# Patient Record
Sex: Female | Born: 1965
Health system: Southern US, Community
[De-identification: ages and names within clinical notes are randomized; demographics above are authoritative.]

---

## 2013-10-27 IMAGING — CT CT HEAD W/O CM
1 series · 16 of 30 positions shown, 20 images · non-contrast
Comparison: None.

CLINICAL DATA: Headache

EXAM:
CT HEAD WITHOUT CONTRAST
TECHNIQUE: Contiguous axial images were obtained from the base of the skull
through the vertex without intravenous contrast.

[Series 2: head 5.0 h30s · axial · 0.41mm/px · z∈[-108,+27]mm · 16 of 30 slices shown, 20 images]
[im 2/30  brain]
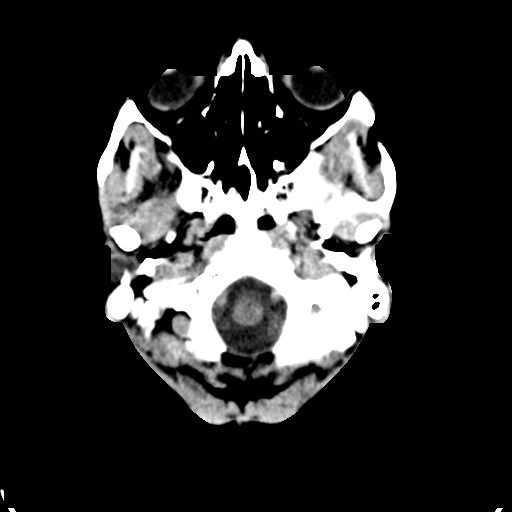
[im 2/30  bone]
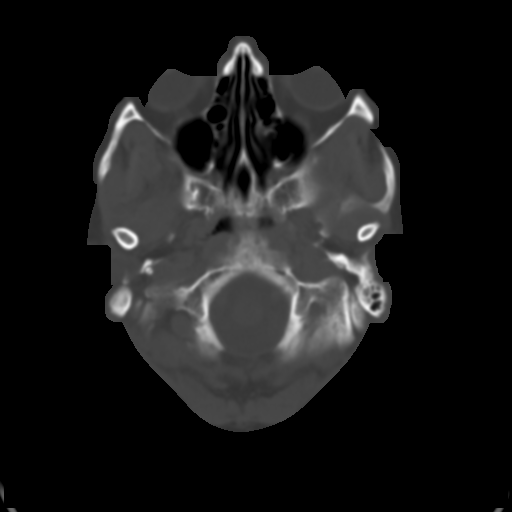
[im 4/30  brain]
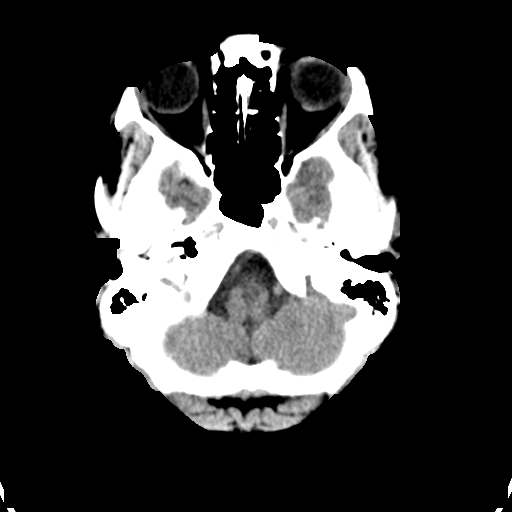
[im 6/30  brain]
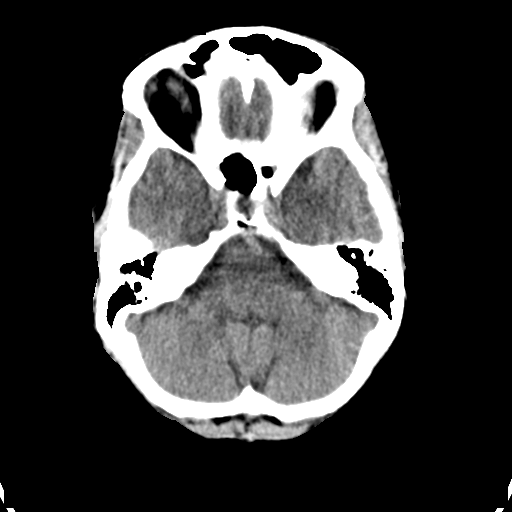
[im 8/30  brain]
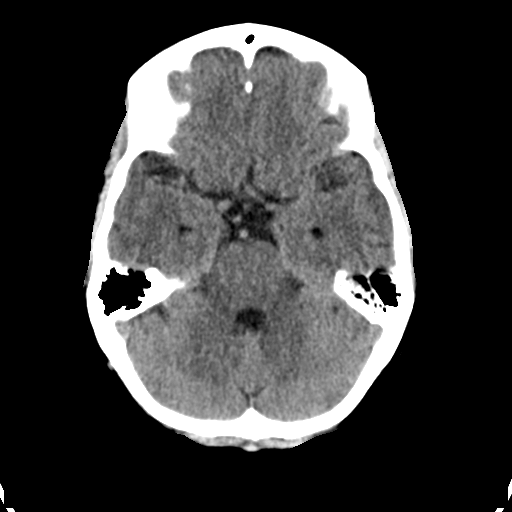
[im 9/30  brain]
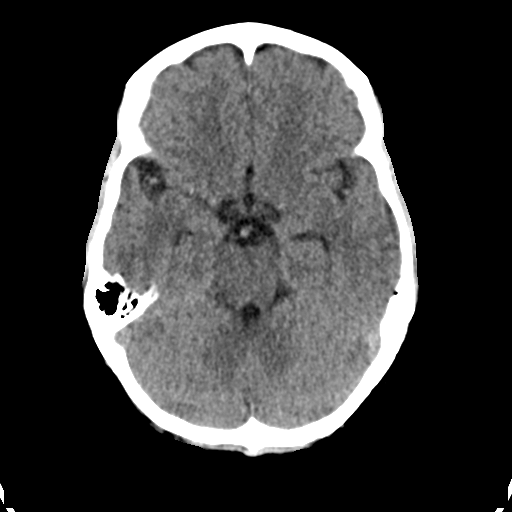
[im 9/30  bone]
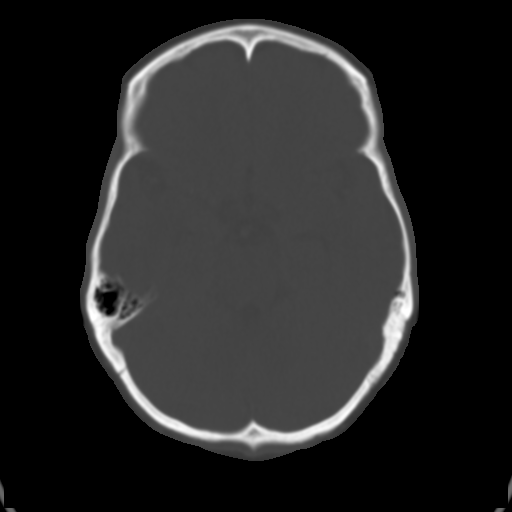
[im 11/30  brain]
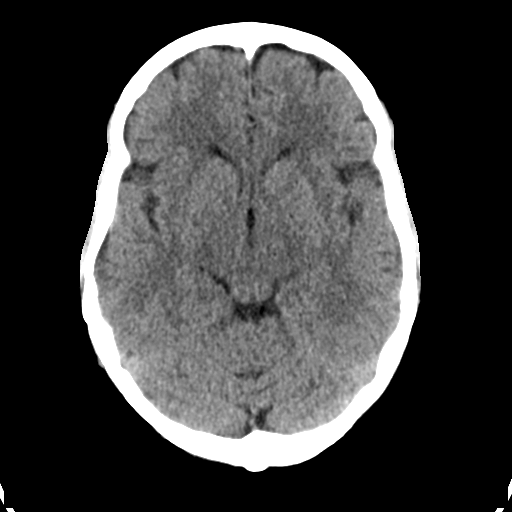
[im 13/30  brain]
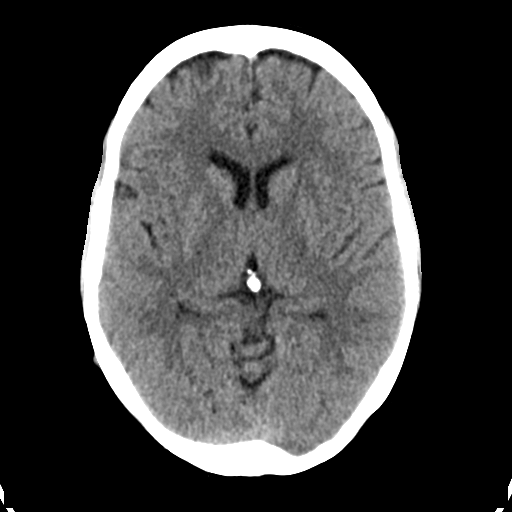
[im 15/30  brain]
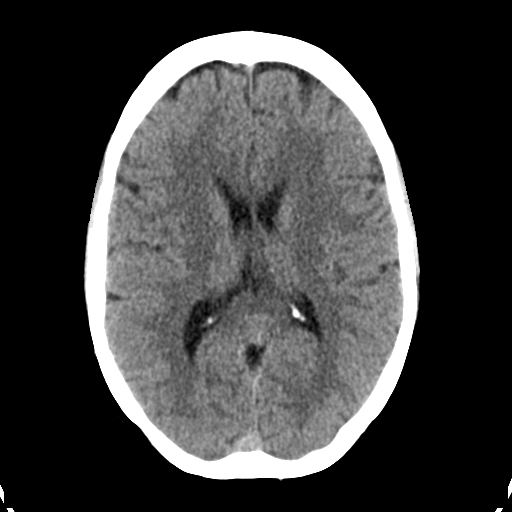
[im 16/30  brain]
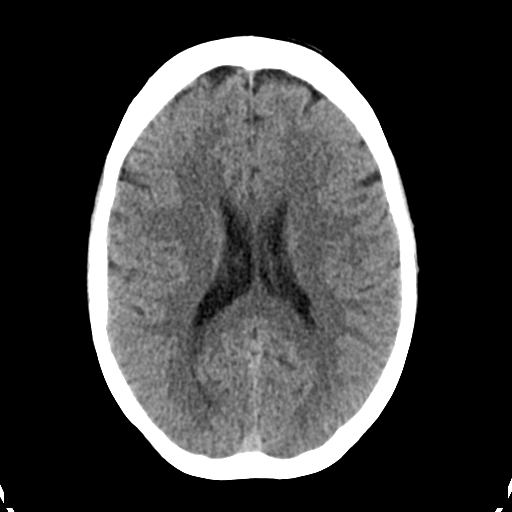
[im 16/30  bone]
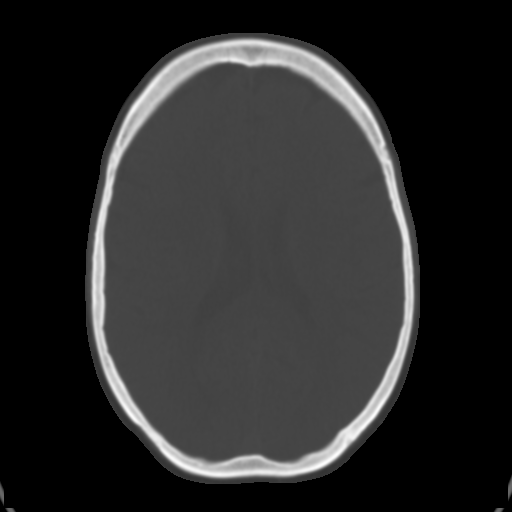
[im 18/30  brain]
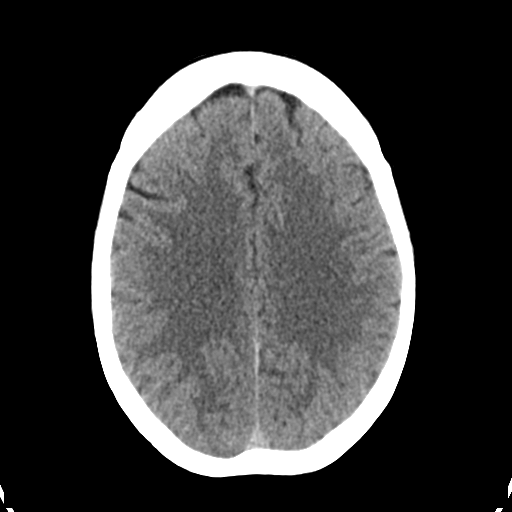
[im 20/30  brain]
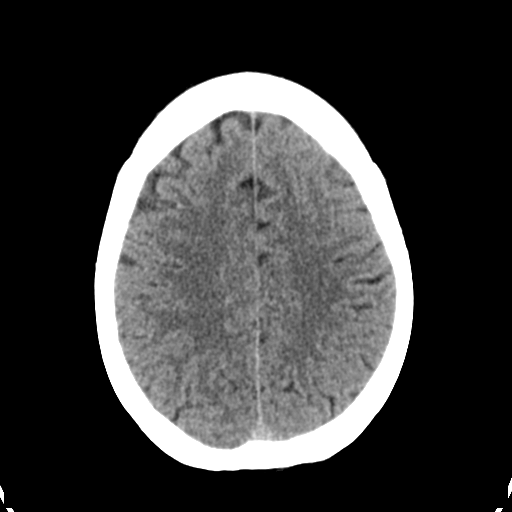
[im 22/30  brain]
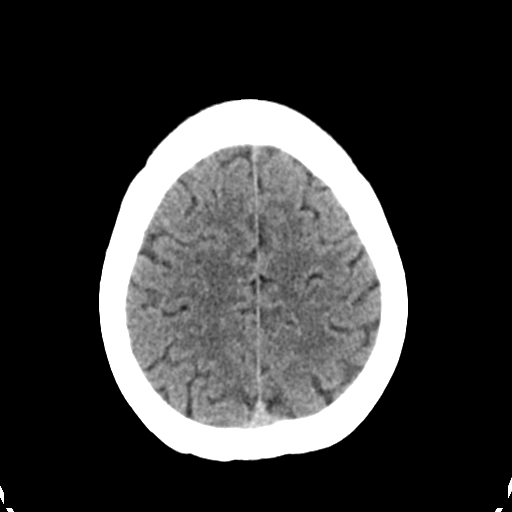
[im 23/30  brain]
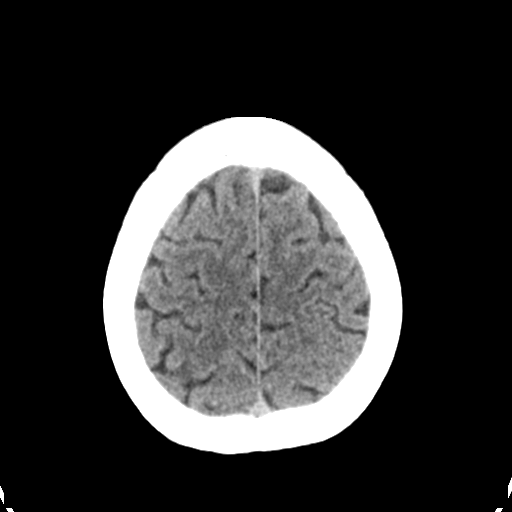
[im 23/30  bone]
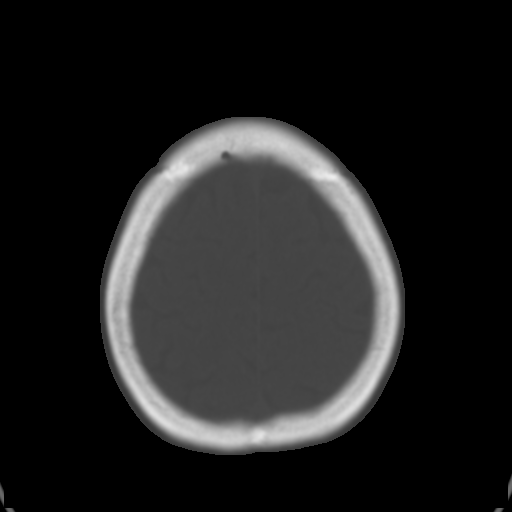
[im 25/30  brain]
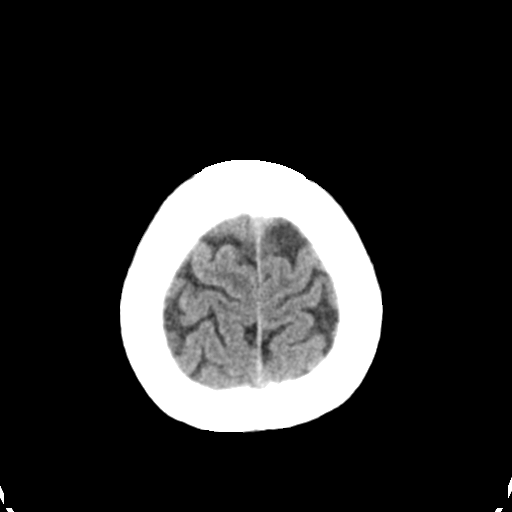
[im 27/30  brain]
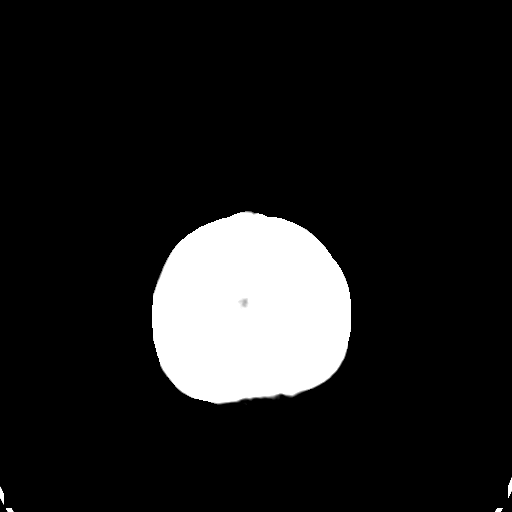
[im 29/30  brain]
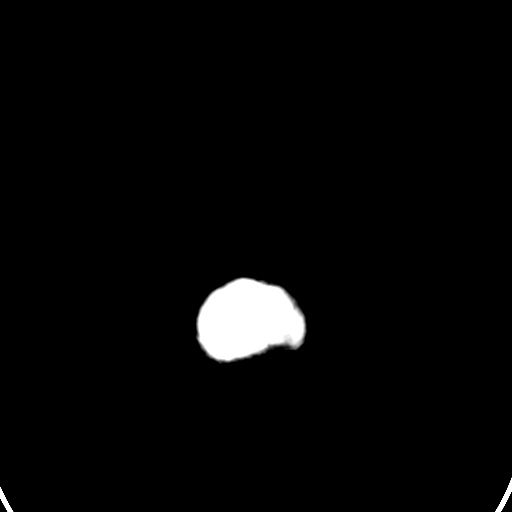

[16 of 30 positions shown; findings below may reference images not displayed]

FINDINGS: The brainstem, cerebellum, cerebral peduncles, thalamus, basal
ganglia, basilar cisterns, and ventricular system appear within
normal limits. No intracranial hemorrhage, mass lesion, or acute
CVA.
IMPRESSION: No significant abnormality identified.

## 2013-10-27 IMAGING — CR DG CHEST 2V
2 series · 2 of 2 positions shown · non-contrast
Comparison: None.

CLINICAL DATA: Asthma.  Syncope.

EXAM:
CHEST  2 VIEW

[w chest pa]
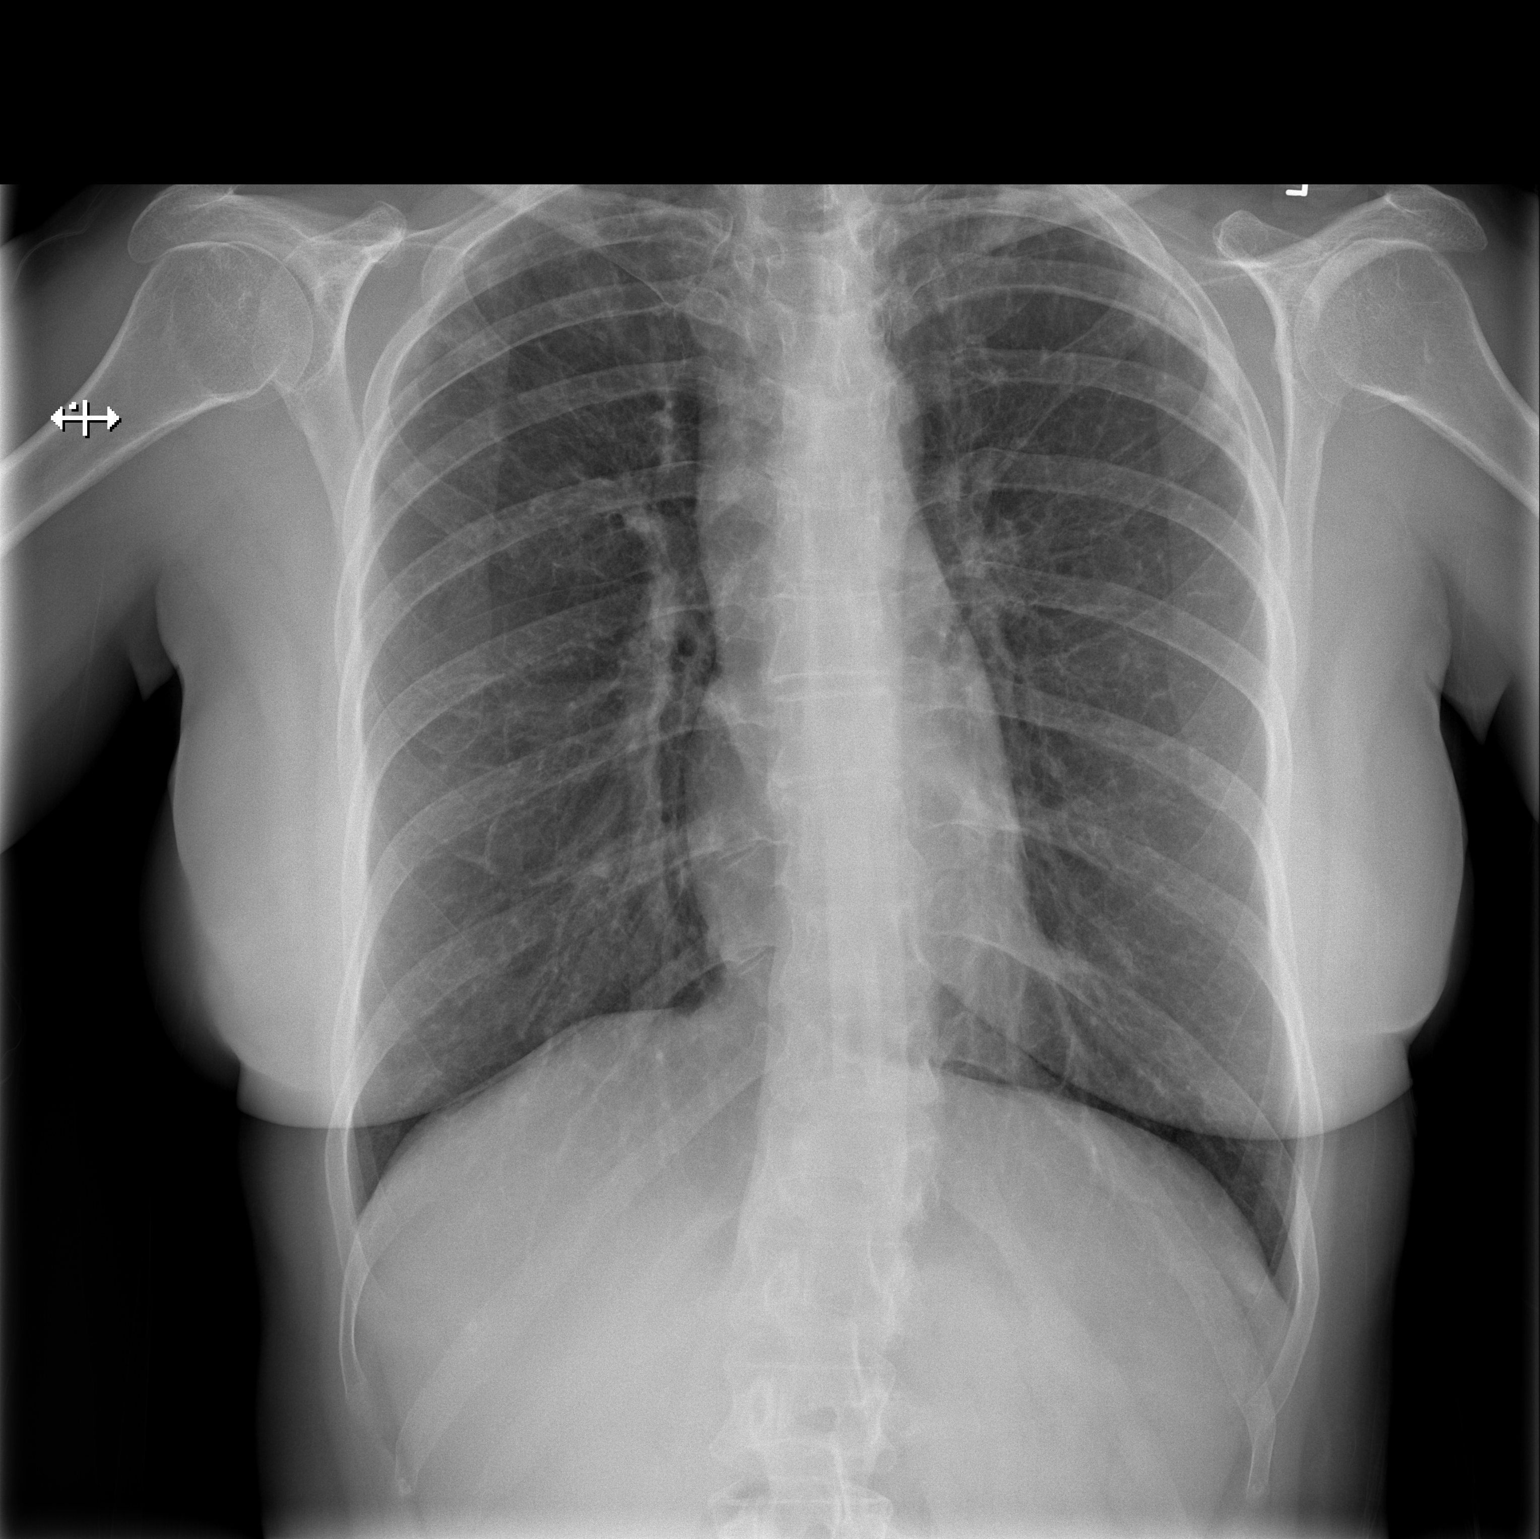

[w chest lat]
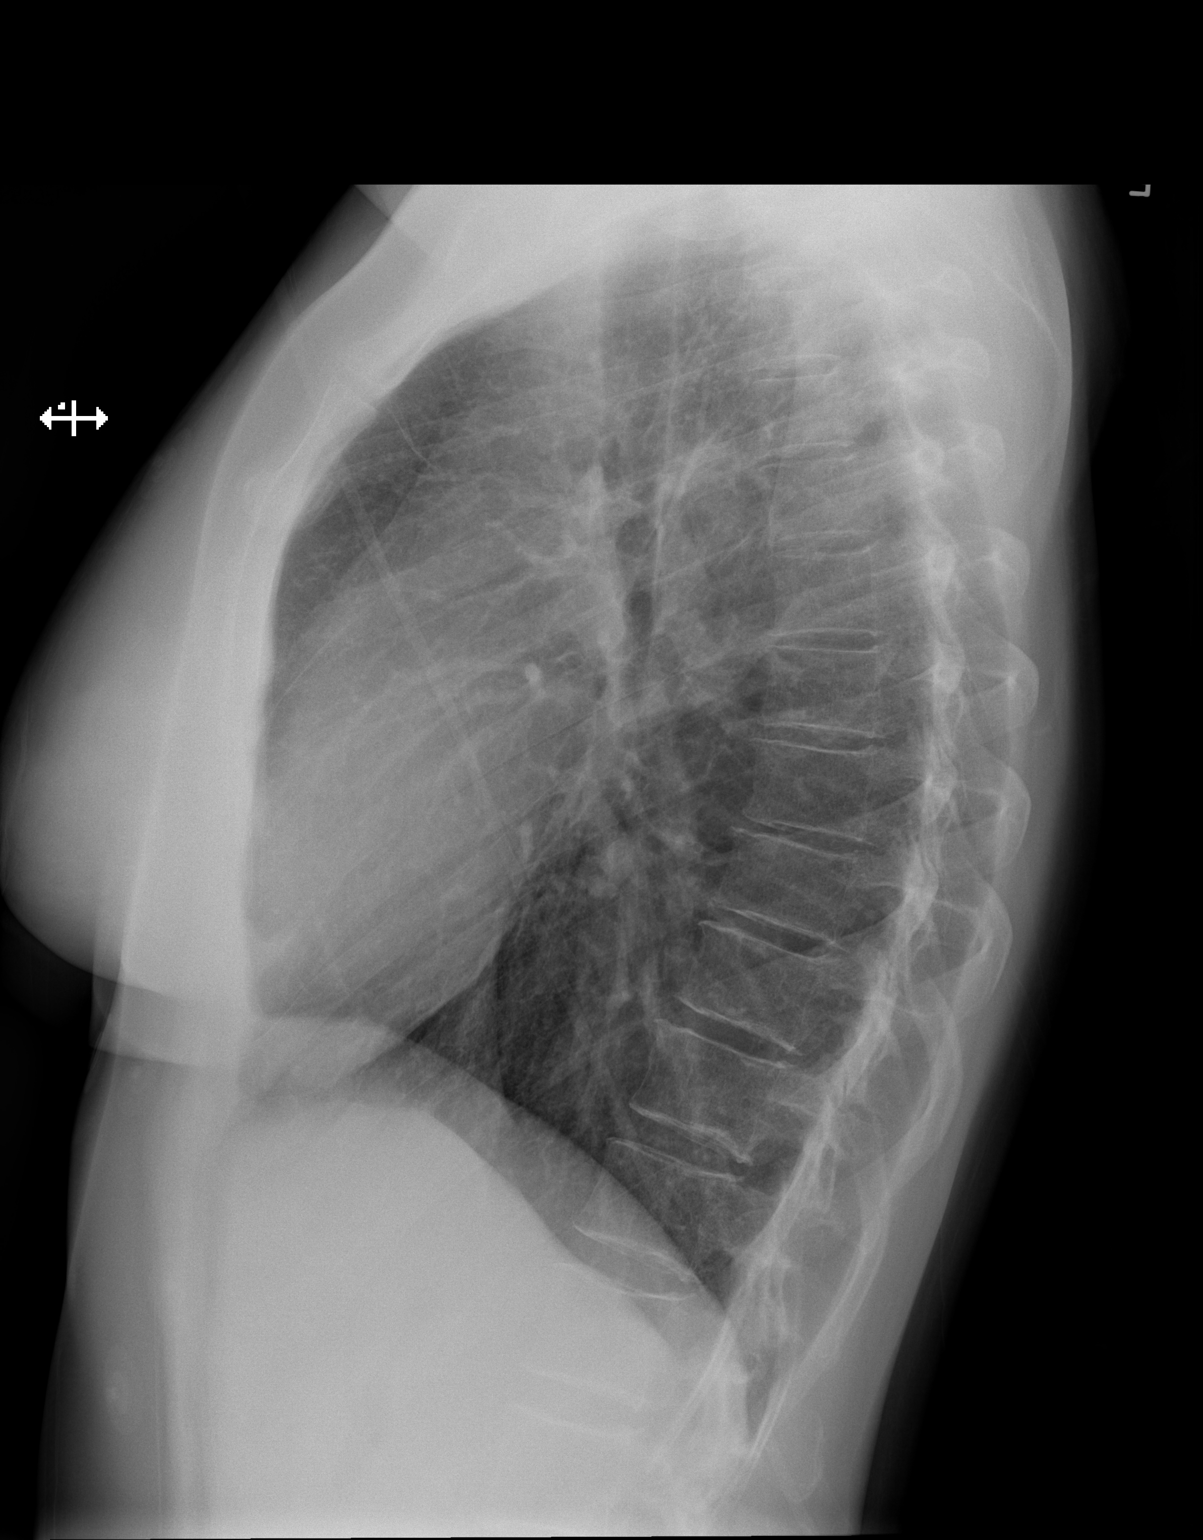

[2 of 2 positions shown; findings below may reference images not displayed]

FINDINGS: Biapical pleural parenchymal scarring. Large lung volumes may
reflect air trapping, but no overt airway thickening is observed.
Cardiac and mediastinal margins appear normal. No significant
osseous abnormality.
IMPRESSION: 1. Biapical pleural parenchymal scarring, probably from remote
granulomatous disease.
2. Large lung volumes could reflect air trapping, but no overt
airway thickening is observed.

## 2015-01-10 IMAGING — CR DG CHEST 2V
2 series · 2 of 2 positions shown · non-contrast
Comparison: [DATE]

CLINICAL DATA: Abnormal pulmonary function test.  Short of breath.

EXAM:
CHEST  2 VIEW

[view not recorded (1 of 2)]
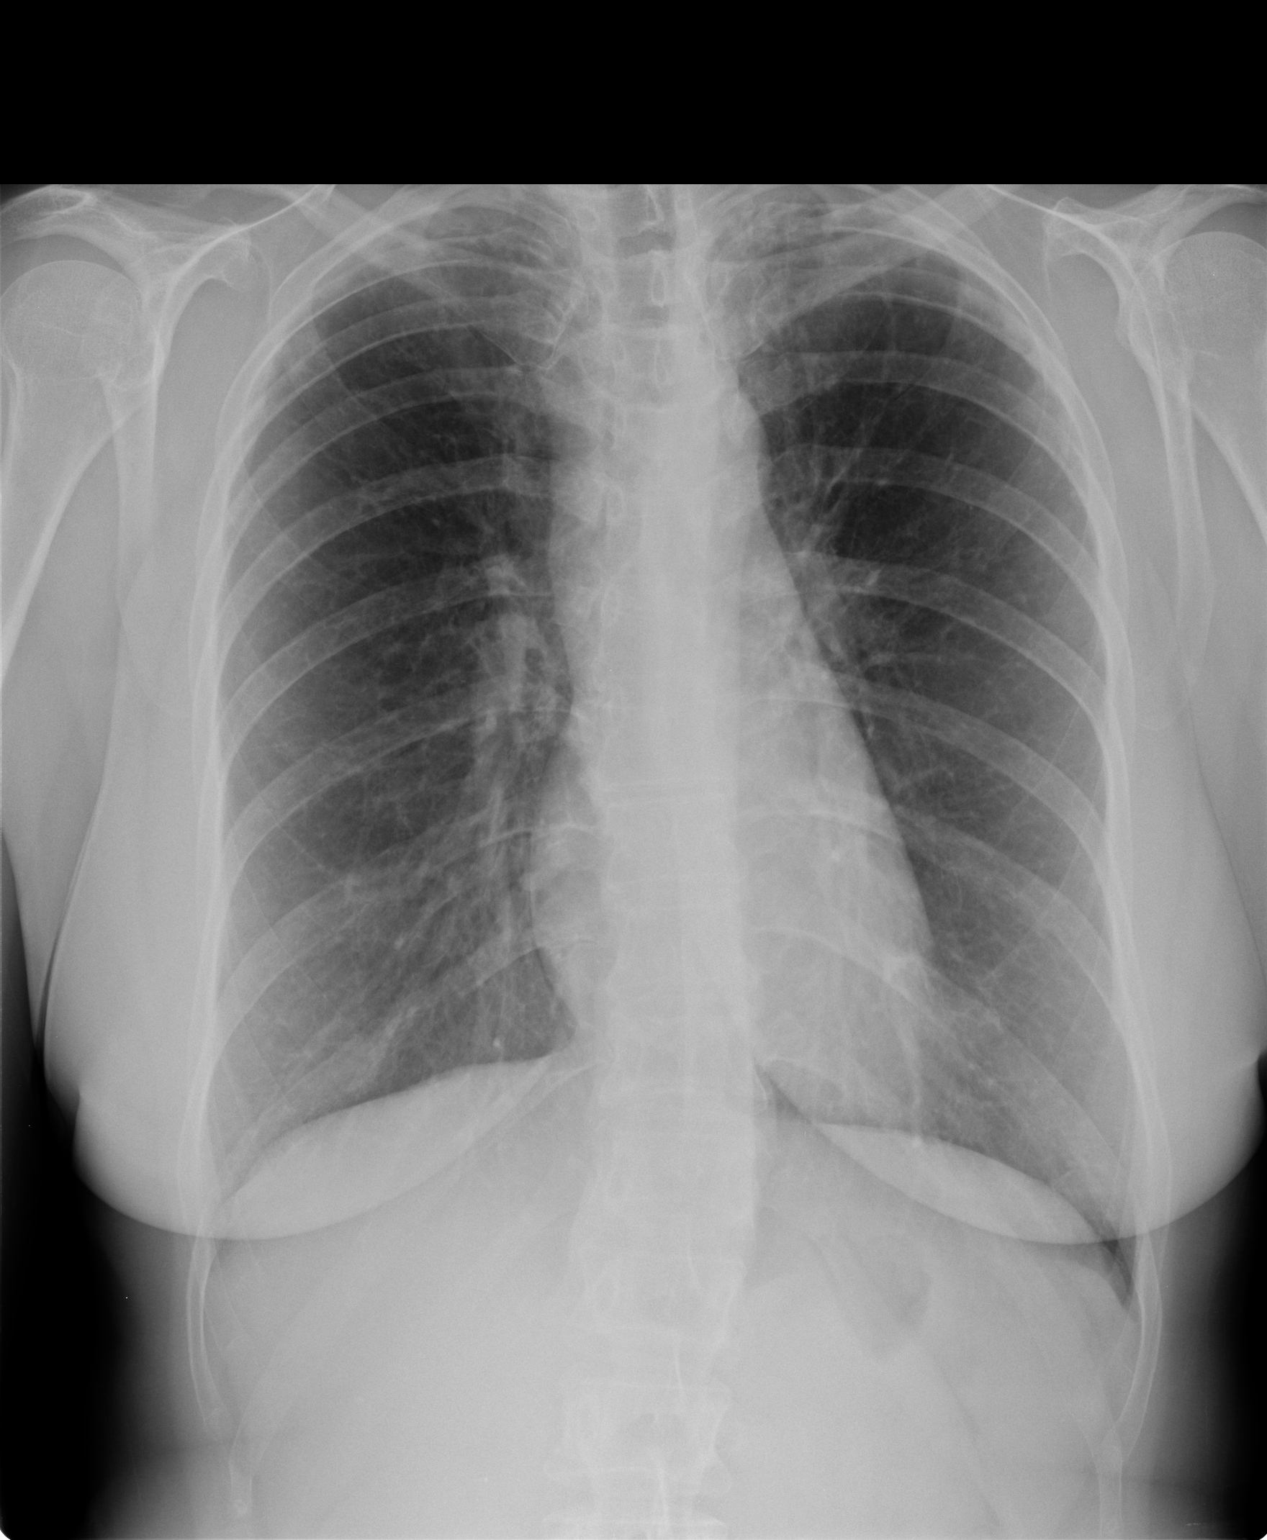

[view not recorded (2 of 2)]
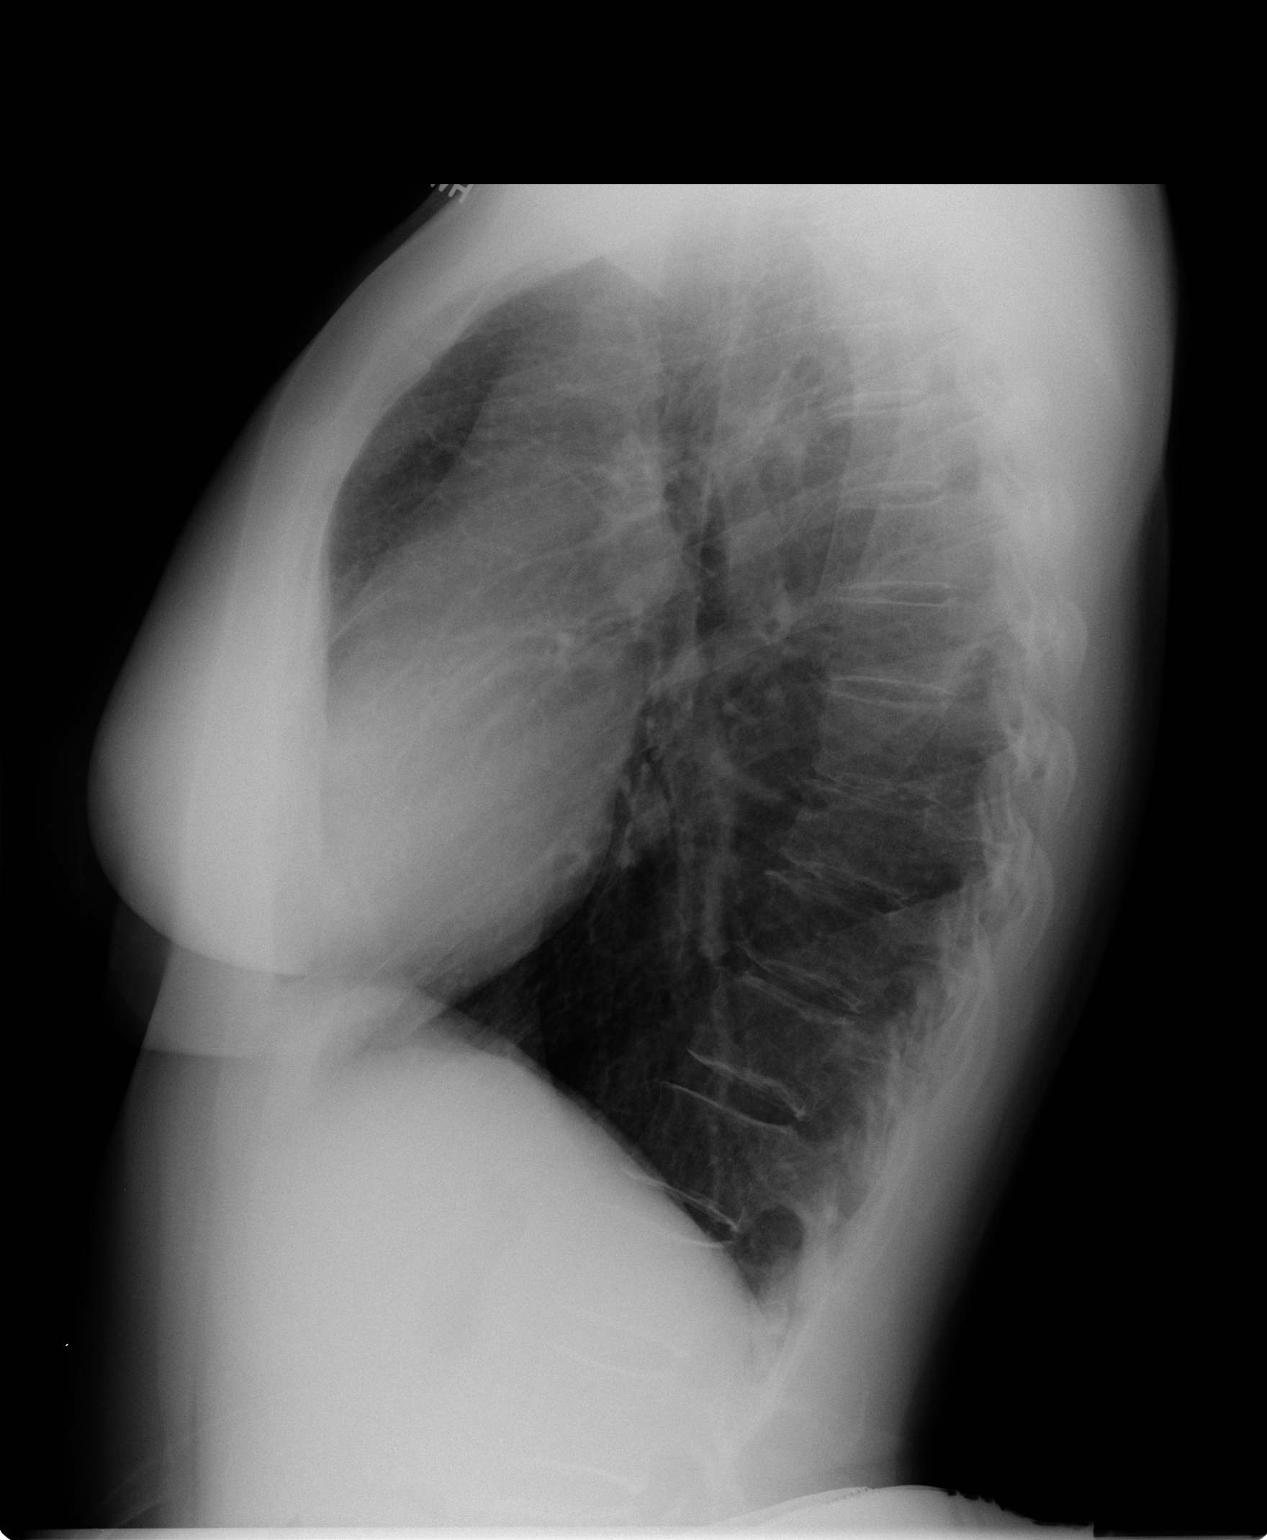

[2 of 2 positions shown; findings below may reference images not displayed]

FINDINGS: Moderate pulmonary hyperinflation, increased from the prior study.
Lungs are clear without infiltrate or effusion. Heart size is
normal. No heart failure. Negative for mass lesion.

Apical scarring bilaterally with apical pleural and parenchymal
scarring.
IMPRESSION: Pulmonary hyperinflation with apical scarring. No superimposed acute
abnormality.

## 2021-06-02 IMAGING — US US BREAST*R* LIMITED INC AXILLA
1 series · 2 of 2 positions shown · non-contrast
Comparison: Previous exam(s).

CLINICAL DATA: Patient recalled from screening for right breast
asymmetry.

EXAM:
DIGITAL DIAGNOSTIC UNILATERAL RIGHT MAMMOGRAM WITH TOMOSYNTHESIS AND
CAD; ULTRASOUND RIGHT BREAST LIMITED
TECHNIQUE: Right digital diagnostic mammography and breast tomosynthesis was
performed. The images were evaluated with computer-aided detection.;
Targeted ultrasound examination of the right breast was performed

[Series 1: us breast*right* limited inc axilla · 0.09mm/px · 2 of 2 slices shown]
[im 1/2]
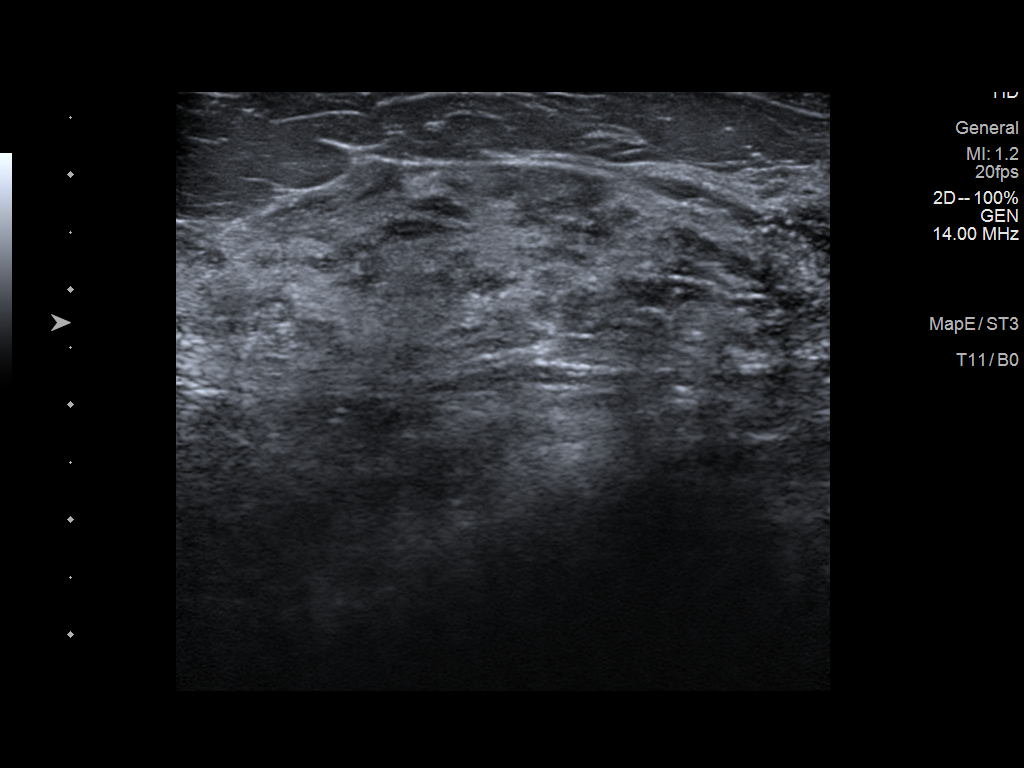
[im 2/2]
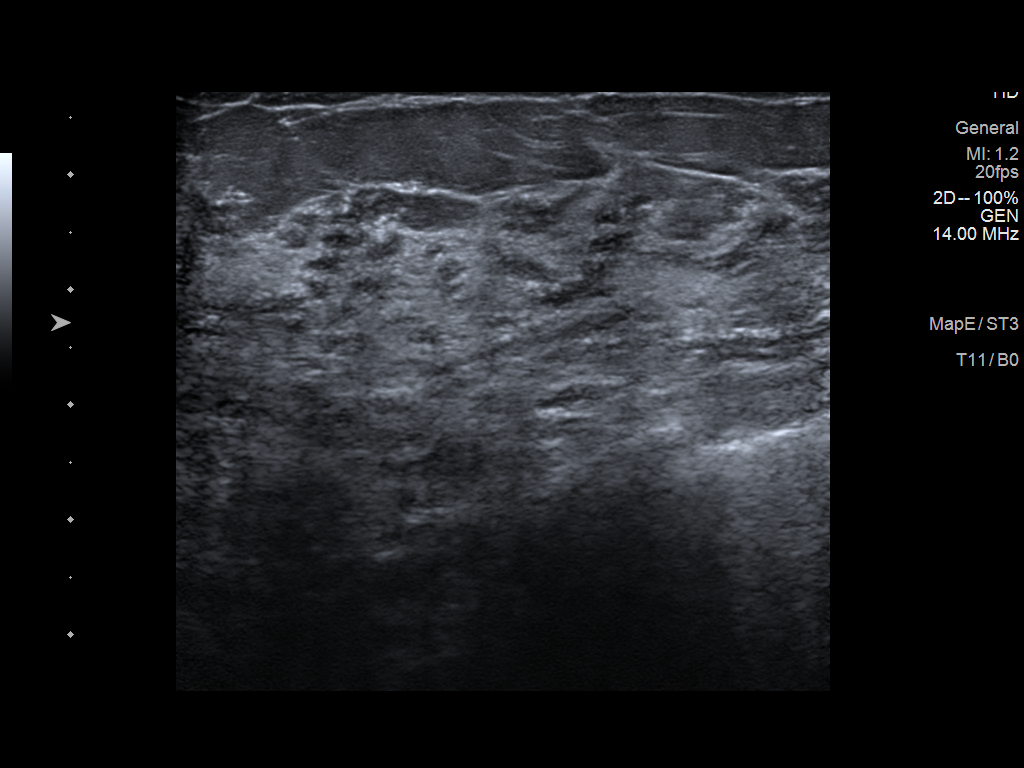

[2 of 2 positions shown; findings below may reference images not displayed]

ACR Breast Density Category c: The breast tissue is heterogeneously
dense, which may obscure small masses.
FINDINGS: Questioned asymmetry within the anterior right breast partially
effaced with additional imaging suggestive of dense fibroglandular
tissue.

Targeted ultrasound is performed, showing normal tissue without
suspicious mass within the retroareolar right breast.
IMPRESSION: No mammographic evidence for malignancy.

RECOMMENDATION:
Screening mammogram in one year.(Code:[Y5])

I have discussed the findings and recommendations with the patient.
If applicable, a reminder letter will be sent to the patient
regarding the next appointment.

BI-RADS CATEGORY  1: Negative.

## 2021-06-02 IMAGING — MG MM DIGITAL DIAGNOSTIC UNILAT*R* W/ TOMO W/ CAD
4 series · 4 of 12 positions shown · non-contrast
Comparison: Previous exam(s).

CLINICAL DATA: Patient recalled from screening for right breast
asymmetry.

EXAM:
DIGITAL DIAGNOSTIC UNILATERAL RIGHT MAMMOGRAM WITH TOMOSYNTHESIS AND
CAD; ULTRASOUND RIGHT BREAST LIMITED
TECHNIQUE: Right digital diagnostic mammography and breast tomosynthesis was
performed. The images were evaluated with computer-aided detection.;
Targeted ultrasound examination of the right breast was performed

[R CC synth-2D]
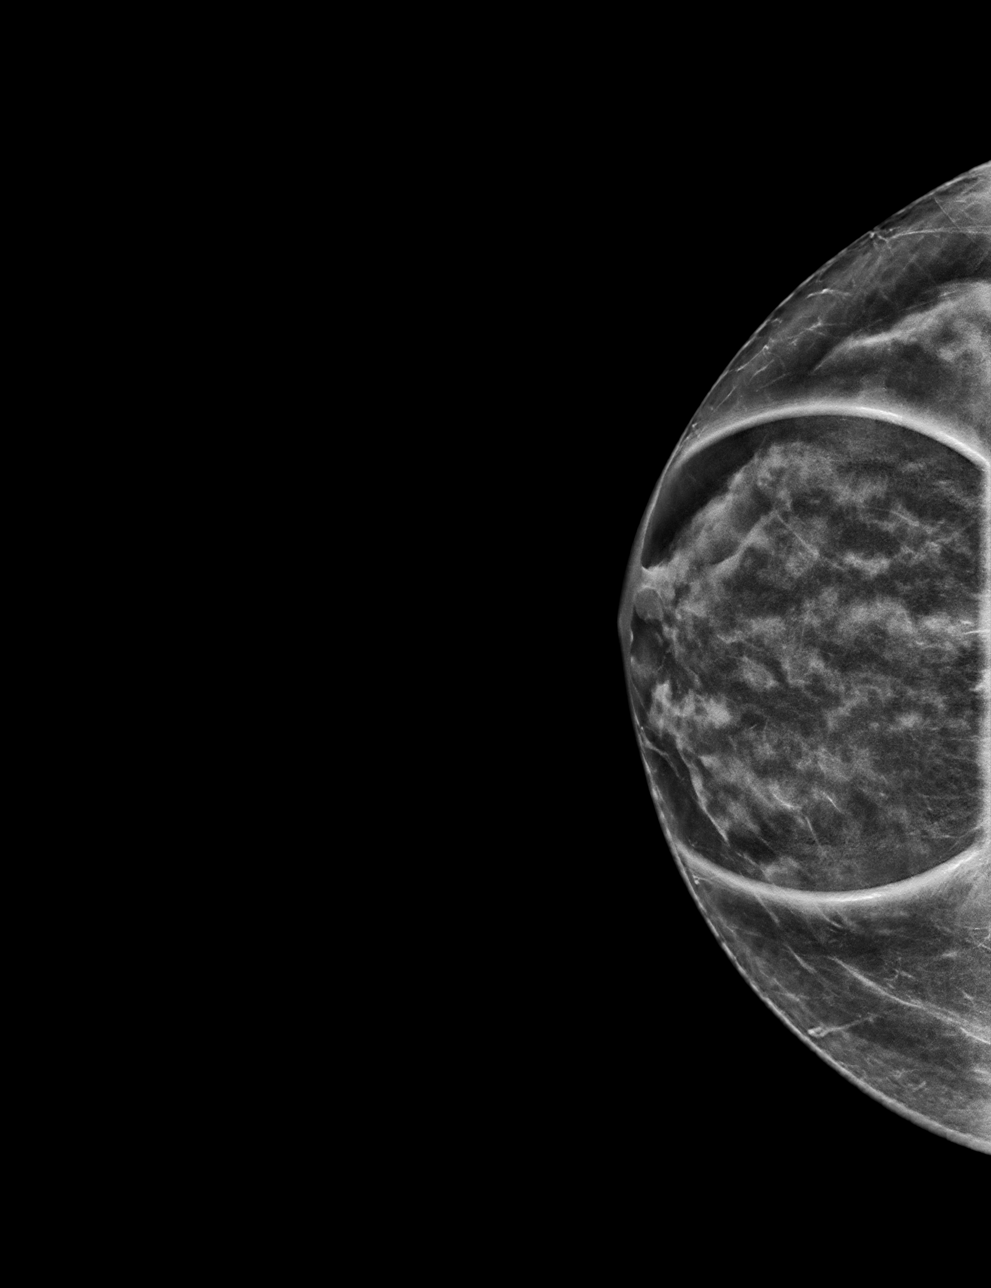

[R MLO synth-2D]
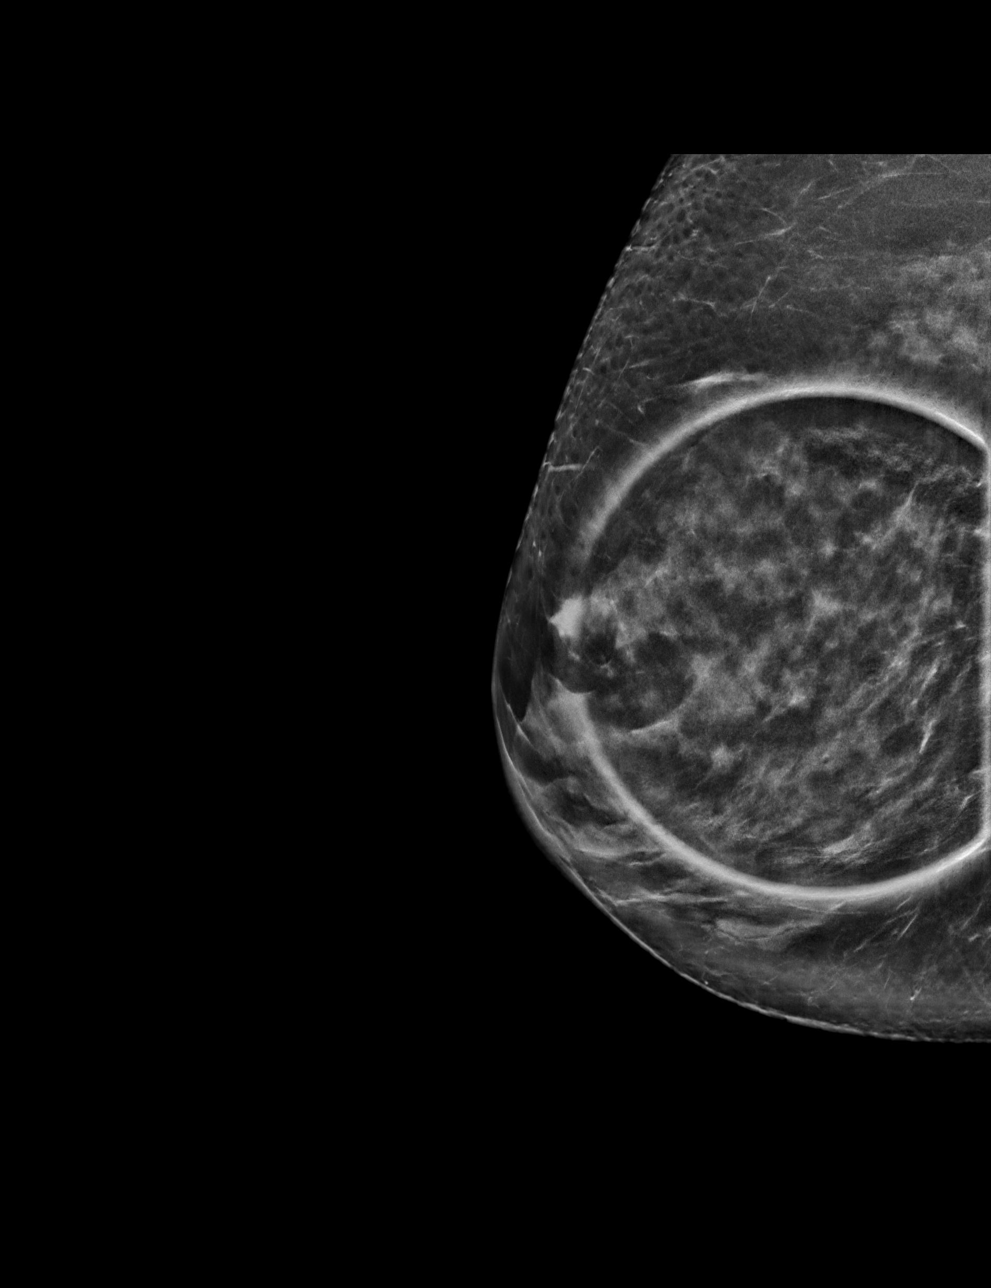

[R MLO tomo · tomo slice 41/81.0]
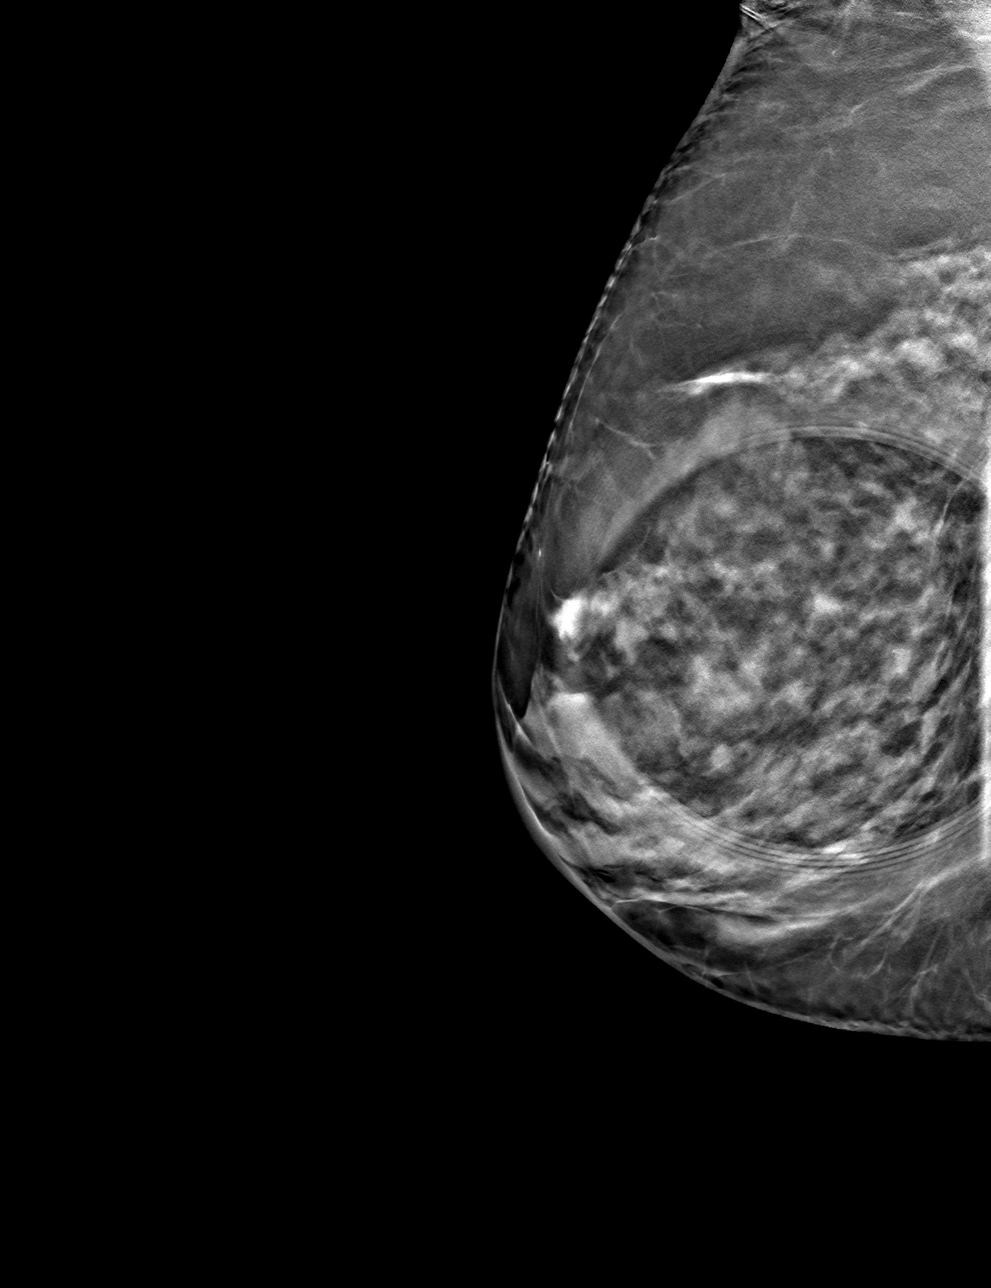

[R CC tomo · tomo slice 33/64.0]
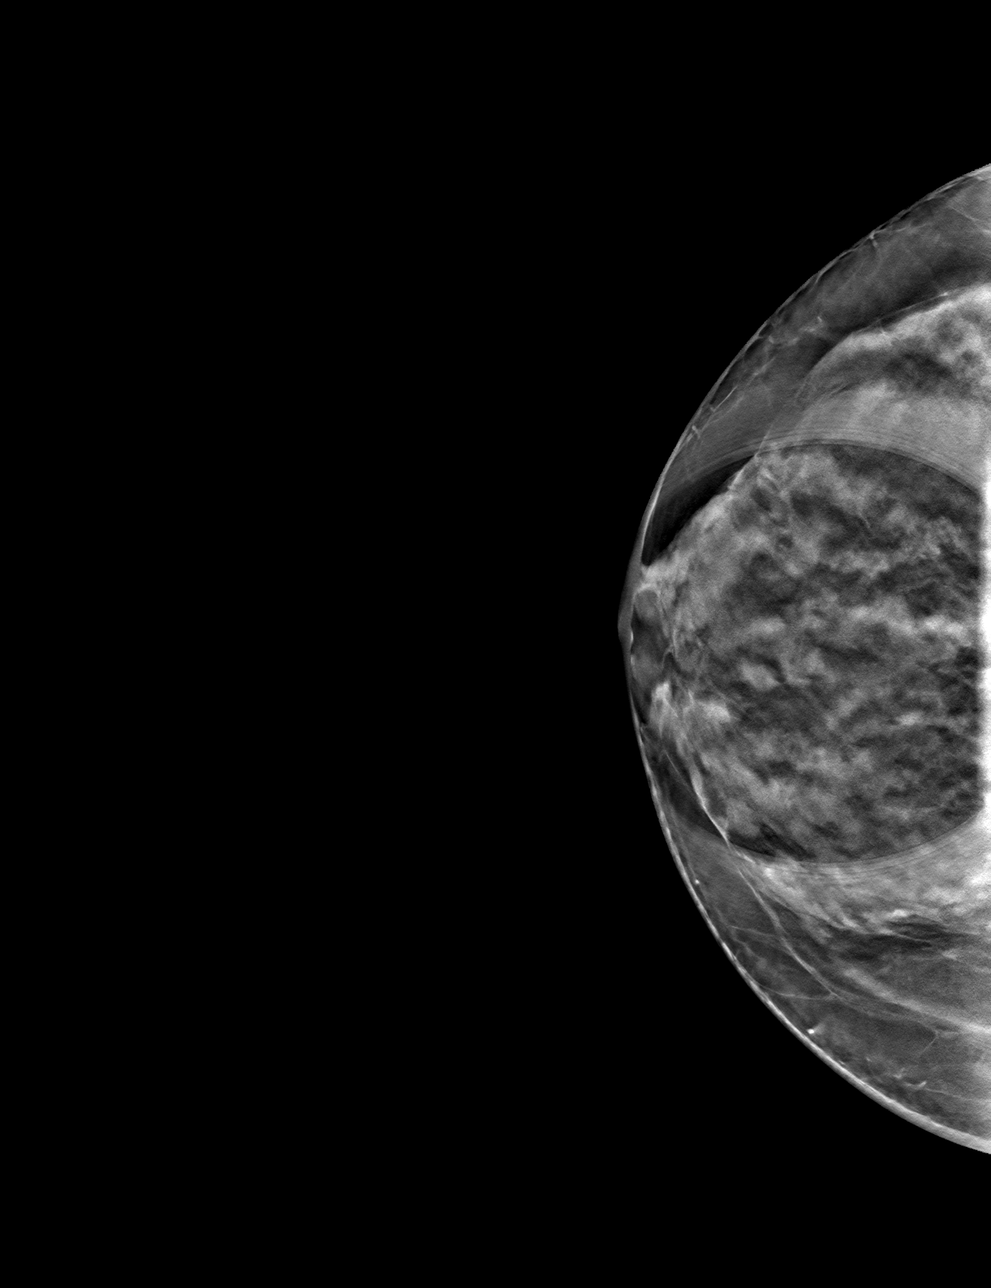

[4 of 12 positions shown; findings below may reference images not displayed]

ACR Breast Density Category c: The breast tissue is heterogeneously
dense, which may obscure small masses.
FINDINGS: Questioned asymmetry within the anterior right breast partially
effaced with additional imaging suggestive of dense fibroglandular
tissue.

Targeted ultrasound is performed, showing normal tissue without
suspicious mass within the retroareolar right breast.
IMPRESSION: No mammographic evidence for malignancy.

RECOMMENDATION:
Screening mammogram in one year.(Code:[Y5])

I have discussed the findings and recommendations with the patient.
If applicable, a reminder letter will be sent to the patient
regarding the next appointment.

BI-RADS CATEGORY  1: Negative.

## 2021-06-23 IMAGING — MR MR BREAST BILAT WO/W CM
7 of 12 series · 27 of 48 positions shown · IV contrast (7ml gadavist)
Comparison: Previous mammography
COMPARISON: Previous mammography
COMPARISON: Previous mammography

Addendum:
CLINICAL DATA: Family history of breast cancer. Patient's mother
was diagnosed with breast cancer at age 68. Patient currently uses
estrogen cream.

LABS:  None
EXAM:
BILATERAL BREAST MRI WITH AND WITHOUT CONTRAST
TECHNIQUE: Multiplanar, multisequence MR images of both breasts were obtained
prior to and following the intravenous administration of 7 ml of
Gadavist

[Series 2: t2_tirm_tra ipat (a-p) · axial · 3.0mm · 0.70mm/px · 1 of 55 slices shown]
[im 1/55]
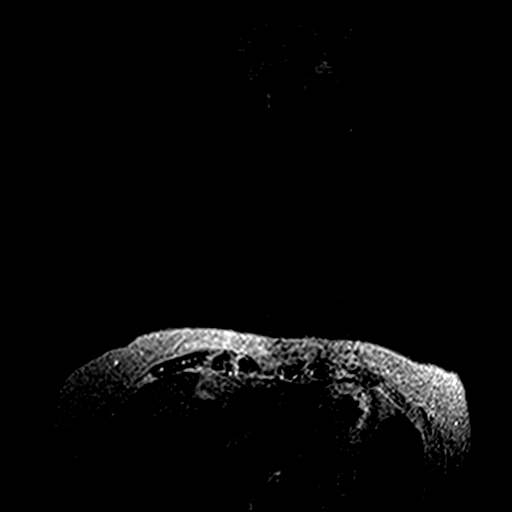

[Series 3: fl3d pre-cm no · axial · non-contrast · 1.2mm · 0.94mm/px · z∈[-40,+113]mm · 5 of 128 slices shown]
[im 1/128]
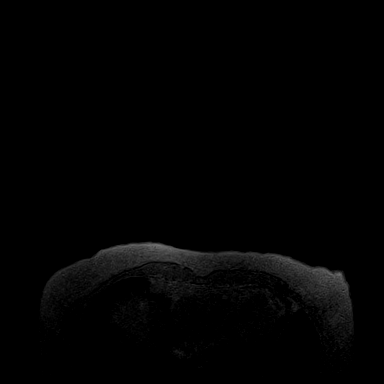
[im 32/128]
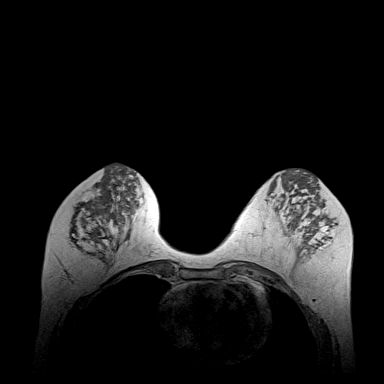
[im 64/128]
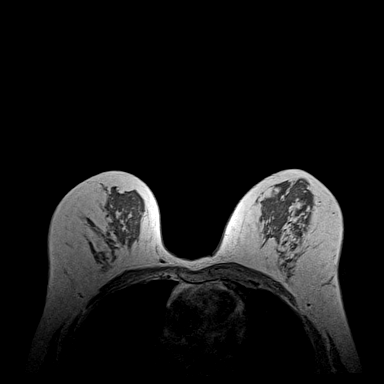
[im 96/128]
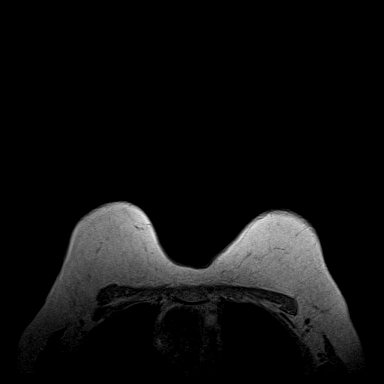
[im 128/128]
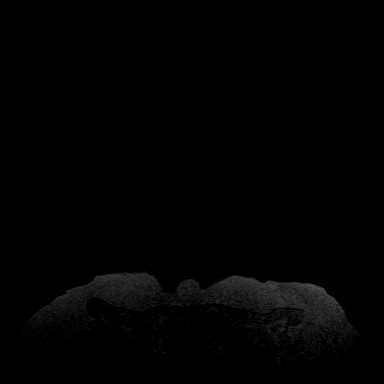

[Series 5: fl3d pre-cm · axial · non-contrast · 1.2mm · 0.89mm/px · z∈[-40,+113]mm · 5 of 128 slices shown]
[im 1/128]
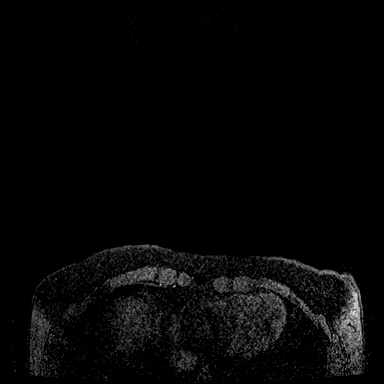
[im 32/128]
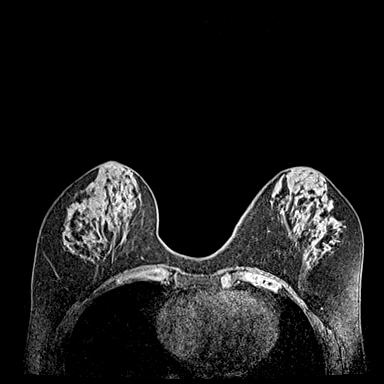
[im 64/128]
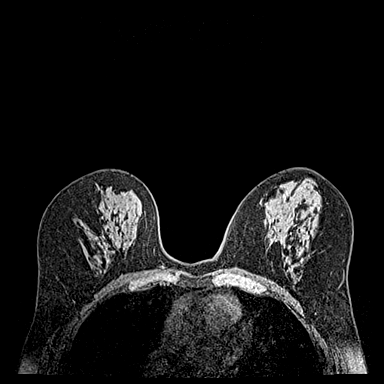
[im 96/128]
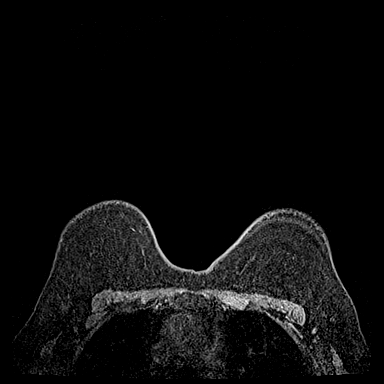
[im 128/128]
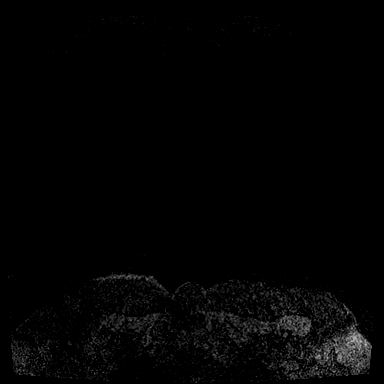

[Series 6: fl3d post-cm 20 · axial · 1.2mm · 0.89mm/px · z∈[-40,+113]mm · 5 of 128 slices shown (1 of 3)]
[im 1/128]
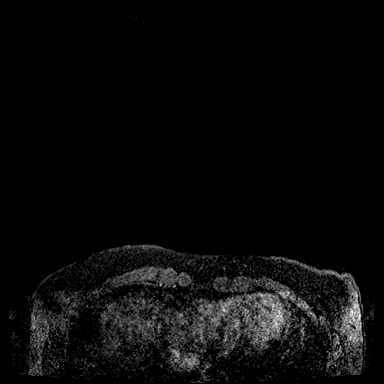
[im 32/128]
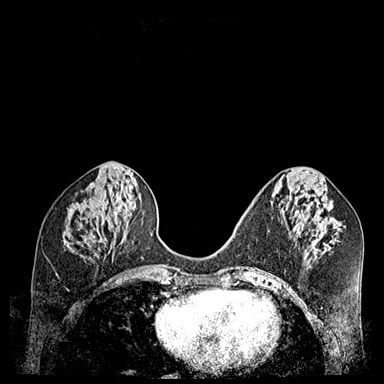
[im 64/128]
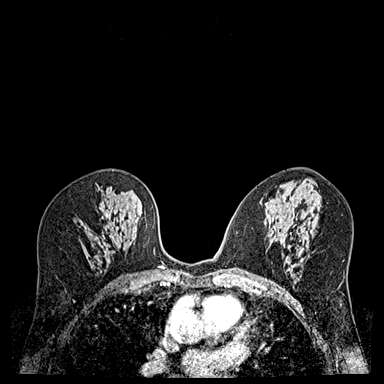
[im 96/128]
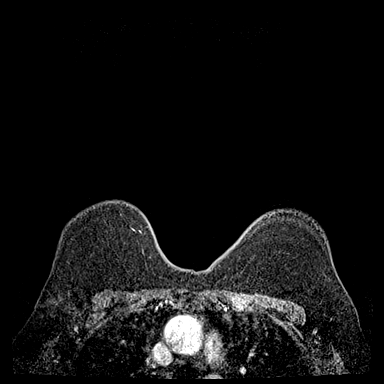
[im 128/128]
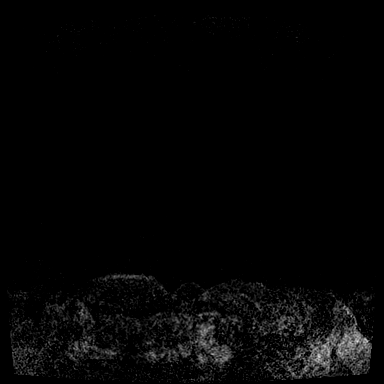

[Series 7: fl3d post-cm 20 · axial · 1.2mm · 0.89mm/px · z∈[-40,+113]mm · 5 of 128 slices shown (2 of 3)]
[im 1/128]
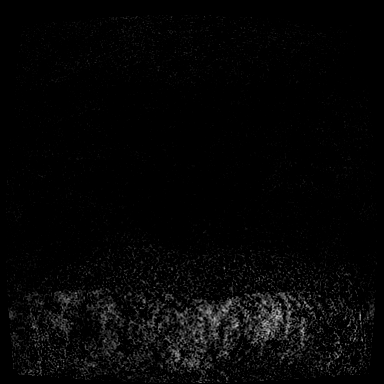
[im 32/128]
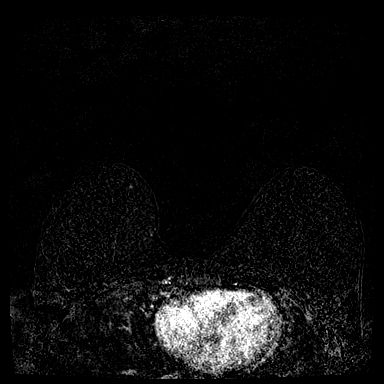
[im 64/128]
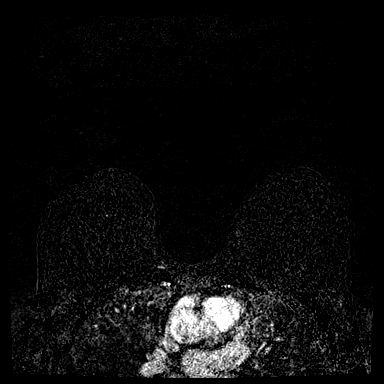
[im 96/128]
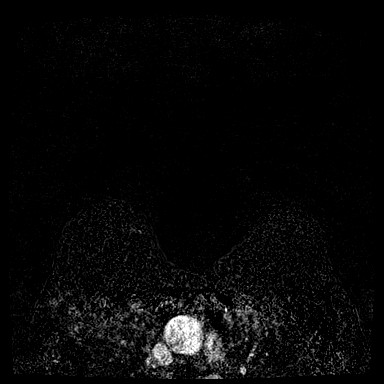
[im 128/128]
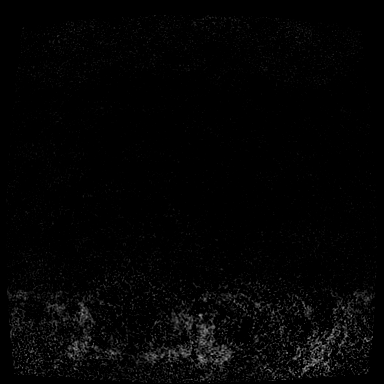

[Series 8: fl3d post-cm 20 · axial · 153.6mm · 0.89mm/px · 1 of 1 slices shown (3 of 3)]
[im 1/1]
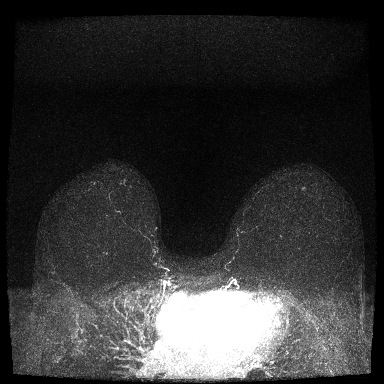

[Series 9: fl3d post-cm 3 · axial · 1.2mm · 0.89mm/px · z∈[-40,+82]mm · 5 of 128 slices shown]
[im 1/128]
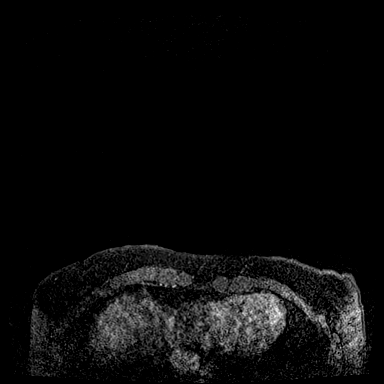
[im 26/128]
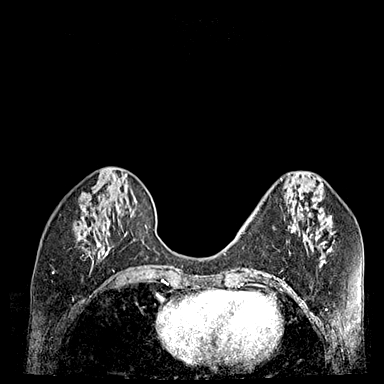
[im 51/128]
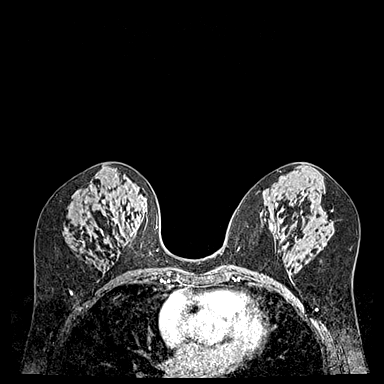
[im 77/128]
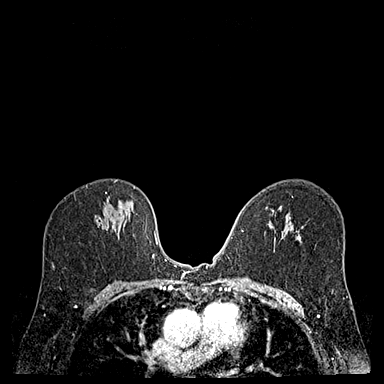
[im 102/128]
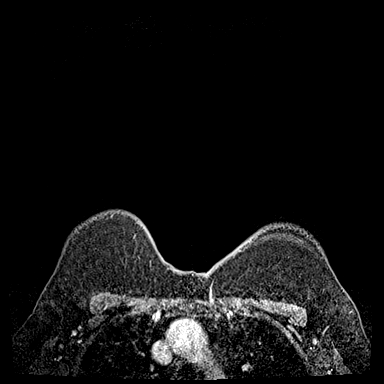

[27 of 48 positions shown; findings below may reference images not displayed]

Three-dimensional MR images were rendered by post-processing of the
original MR data on an independent workstation. The
three-dimensional MR images were interpreted, and findings are
reported in the following complete MRI report for this study. Three
dimensional images were evaluated at the independent interpreting
workstation using the [employer] thin client.
FINDINGS: Breast composition: c. Heterogeneous fibroglandular tissue.

Background parenchymal enhancement: Mild

Right breast: There is non mass enhancement in the slightly medial
central right breast on series 10, image 92 measuring up to 7 mm
with persistent kinetics. Just inferior to this non mass enhancement
is a nother site of non mass enhancement on series 10, image 98
measuring 6 mm with persistent kinetics. No other suspicious
findings in the right breast.

Left breast: There is an irregular mass at 12 o'clock in the left
breast as seen on series 10, image 82 measuring 6 mm with persistent
kinetics. No other abnormalities in the left breast.

Lymph nodes: No abnormal appearing lymph nodes.

Ancillary findings:  None.
IMPRESSION: Two regions of non mass enhancement in the medial left breast and 1
irregular mass at 12 o'clock in the left breast.

RECOMMENDATION:
Recommend MRI guided biopsy of the 2 regions of non mass enhancement
in the medial right breast. Recommend MRI guided biopsy of the
irregular mass in the left breast. Given the location of the right
and left breast findings, the right breast biopsies will have to be
performed on a different day than the left breast biopsy.

BI-RADS CATEGORY  4: Suspicious.
IMPRESSION: 2 regions of non mass enhancement in the medial right breast and 1
irregular mass at 12 o'clock in the left breast.

ADDENDUM:
Two regions of non mass enhancement are identified in the right
breast and 1 irregular masses identified in the left breast.

*** End of Addendum ***
Addendum:
Three-dimensional MR images were rendered by post-processing of the
original MR data on an independent workstation. The
three-dimensional MR images were interpreted, and findings are
reported in the following complete MRI report for this study. Three
dimensional images were evaluated at the independent interpreting
workstation using the [employer] thin client.
FINDINGS: Breast composition: c. Heterogeneous fibroglandular tissue.

Background parenchymal enhancement: Mild

Right breast: There is non mass enhancement in the slightly medial
central right breast on series 10, image 92 measuring up to 7 mm
with persistent kinetics. Just inferior to this non mass enhancement
is a nother site of non mass enhancement on series 10, image 98
measuring 6 mm with persistent kinetics. No other suspicious
findings in the right breast.

Left breast: There is an irregular mass at 12 o'clock in the left
breast as seen on series 10, image 82 measuring 6 mm with persistent
kinetics. No other abnormalities in the left breast.

Lymph nodes: No abnormal appearing lymph nodes.

Ancillary findings:  None.
IMPRESSION: Two regions of non mass enhancement in the medial left breast and 1
irregular mass at 12 o'clock in the left breast.

RECOMMENDATION:
Recommend MRI guided biopsy of the 2 regions of non mass enhancement
in the medial right breast. Recommend MRI guided biopsy of the
irregular mass in the left breast. Given the location of the right
and left breast findings, the right breast biopsies will have to be
performed on a different day than the left breast biopsy.

BI-RADS CATEGORY  4: Suspicious.
IMPRESSION: 2 regions of non mass enhancement in the medial right breast and 1
irregular mass at 12 o'clock in the left breast.

*** End of Addendum ***
Three-dimensional MR images were rendered by post-processing of the
original MR data on an independent workstation. The
three-dimensional MR images were interpreted, and findings are
reported in the following complete MRI report for this study. Three
dimensional images were evaluated at the independent interpreting
workstation using the [employer] thin client.
FINDINGS: Breast composition: c. Heterogeneous fibroglandular tissue.

Background parenchymal enhancement: Mild

Right breast: There is non mass enhancement in the slightly medial
central right breast on series 10, image 92 measuring up to 7 mm
with persistent kinetics. Just inferior to this non mass enhancement
is a nother site of non mass enhancement on series 10, image 98
measuring 6 mm with persistent kinetics. No other suspicious
findings in the right breast.

Left breast: There is an irregular mass at 12 o'clock in the left
breast as seen on series 10, image 82 measuring 6 mm with persistent
kinetics. No other abnormalities in the left breast.

Lymph nodes: No abnormal appearing lymph nodes.

Ancillary findings:  None.
IMPRESSION: Two regions of non mass enhancement in the medial left breast and 1
irregular mass at 12 o'clock in the left breast.

RECOMMENDATION:
Recommend MRI guided biopsy of the 2 regions of non mass enhancement
in the medial right breast. Recommend MRI guided biopsy of the
irregular mass in the left breast. Given the location of the right
and left breast findings, the right breast biopsies will have to be
performed on a different day than the left breast biopsy.

BI-RADS CATEGORY  4: Suspicious.

## 2021-07-17 IMAGING — MR MR BREAST BX W LOC DEV 1ST LESION IMAGE BX SPEC MR GUIDE*L*
7 of 10 series · 33 of 48 positions shown · IV contrast (7 ml multihance)
Comparison: Previous exams.
COMPARISON: Previous exams.

Addendum:
CLINICAL DATA: Patient presents for MRI guided core needle biopsy
of a 6 mm enhancing mass in the left breast.

EXAM:
MRI GUIDED CORE NEEDLE BIOPSY OF THE LEFT BREAST
TECHNIQUE: Multiplanar, multisequence MR imaging of the left breast was
performed both before and after administration of intravenous
contrast.
CONTRAST:  7 mL of Gadavist

[Series 2: fiducial unilateral · sagittal · 2.0mm · 1.33mm/px · 3 of 52 slices shown]
[im 1/52]
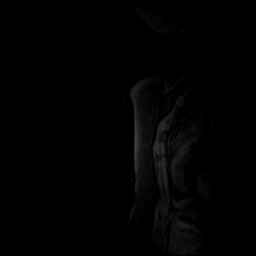
[im 26/52]
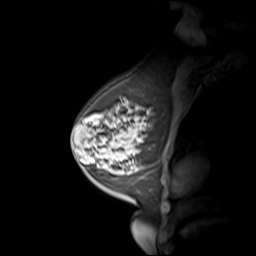
[im 52/52]
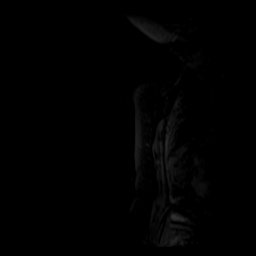

[Series 3: dynamic pre · axial · non-contrast · 1.3mm · 0.73mm/px · z∈[-92,+93]mm · 5 of 144 slices shown]
[im 1/144]
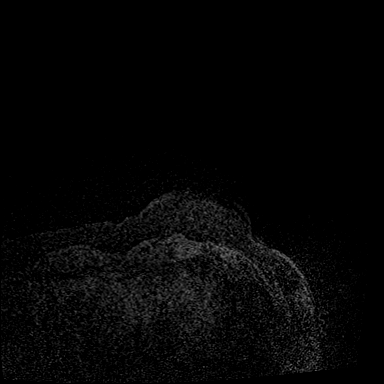
[im 36/144]
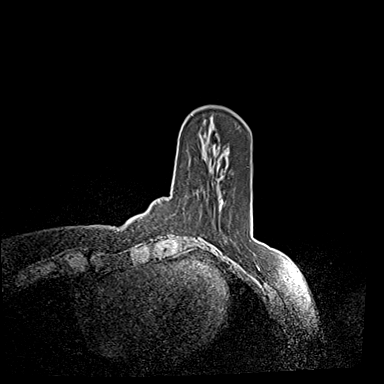
[im 72/144]
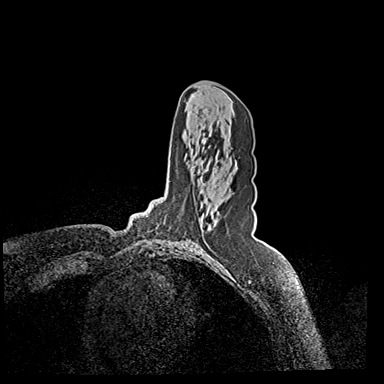
[im 108/144]
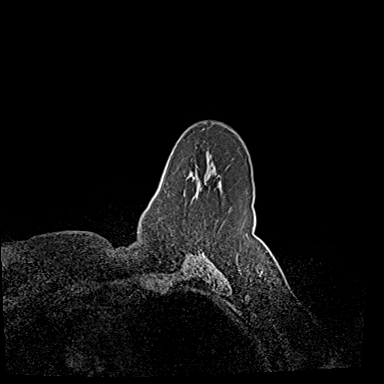
[im 144/144]
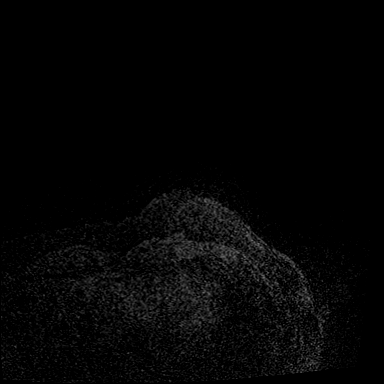

[Series 4: dynamic post 20 · axial · 1.3mm · 0.73mm/px · z∈[-92,+93]mm · 5 of 144 slices shown (1 of 2)]
[im 1/144]
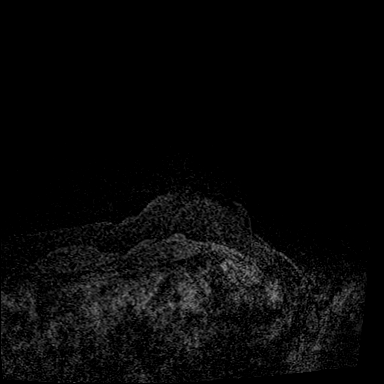
[im 36/144]
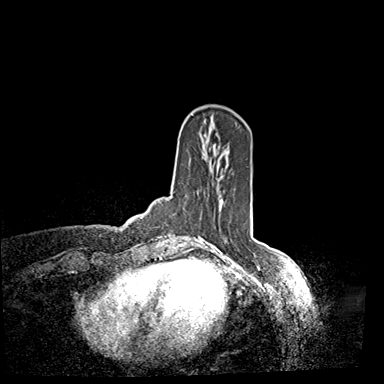
[im 72/144]
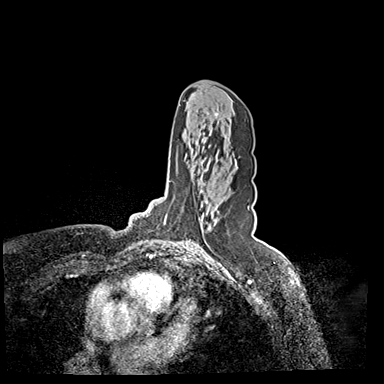
[im 108/144]
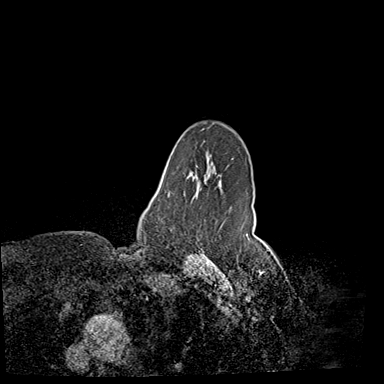
[im 144/144]
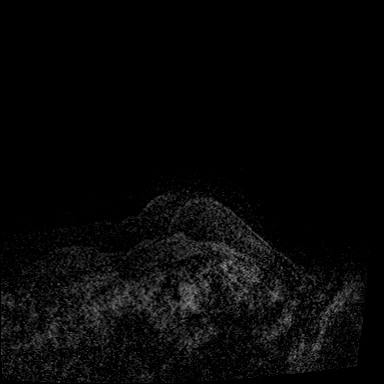

[Series 5: dynamic post 20 · axial · 1.3mm · 0.73mm/px · z∈[-92,+93]mm · 5 of 144 slices shown (2 of 2)]
[im 1/144]
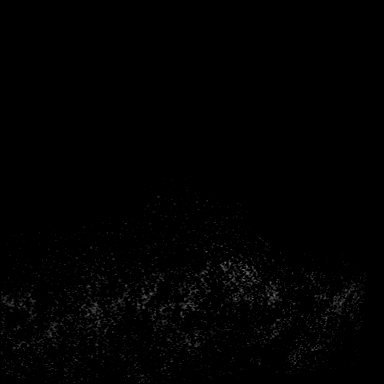
[im 36/144]
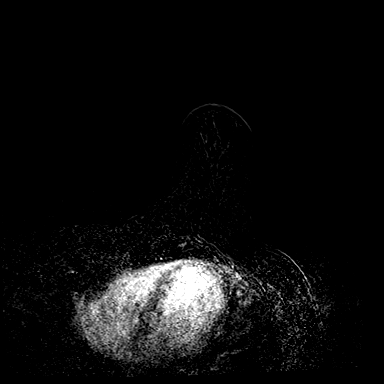
[im 72/144]
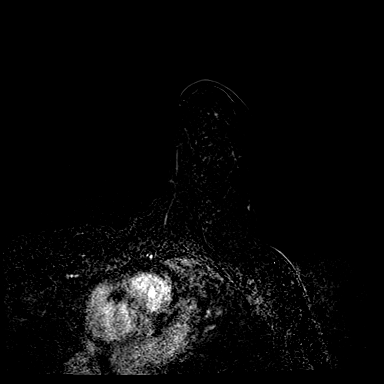
[im 108/144]
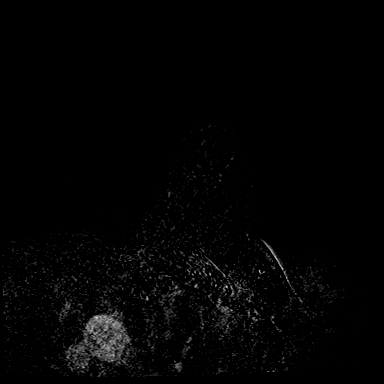
[im 144/144]
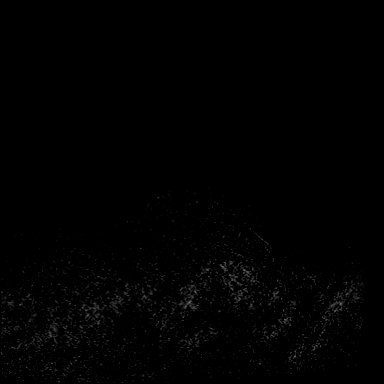

[Series 6: dynamic post 3 · axial · 1.3mm · 0.73mm/px · z∈[-92,+93]mm · 5 of 144 slices shown (1 of 2)]
[im 1/144]
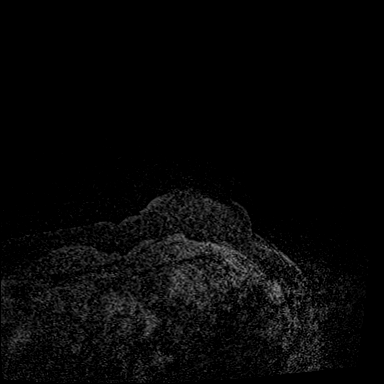
[im 36/144]
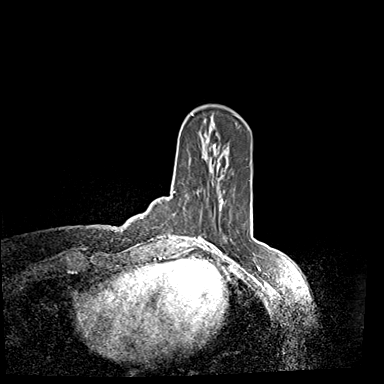
[im 72/144]
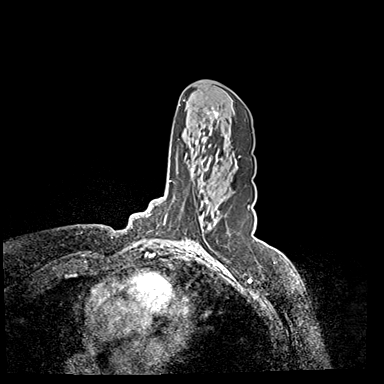
[im 108/144]
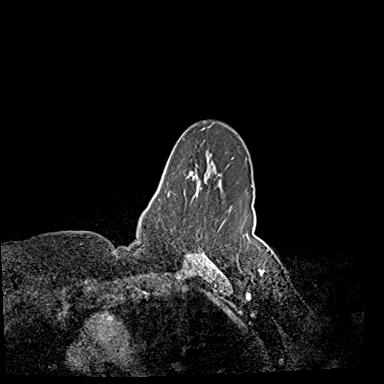
[im 144/144]
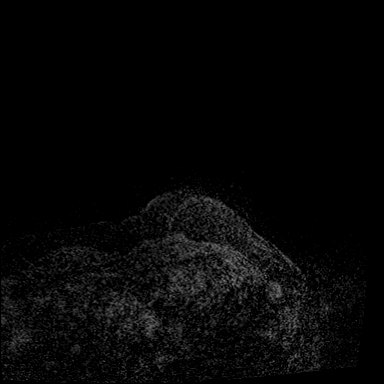

[Series 7: dynamic post 3 · axial · 1.3mm · 0.73mm/px · z∈[-92,+93]mm · 5 of 144 slices shown (2 of 2)]
[im 1/144]
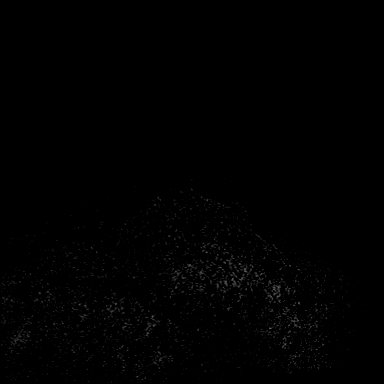
[im 36/144]
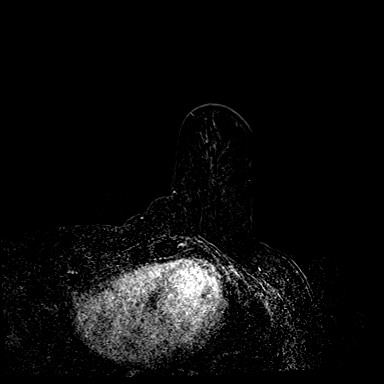
[im 72/144]
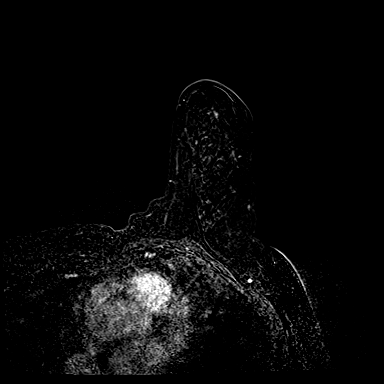
[im 108/144]
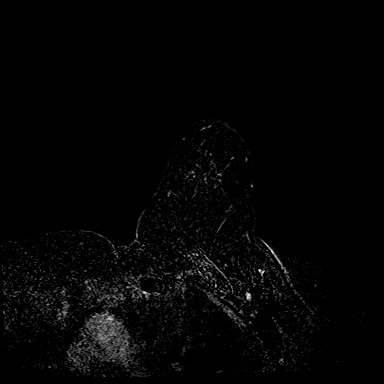
[im 144/144]
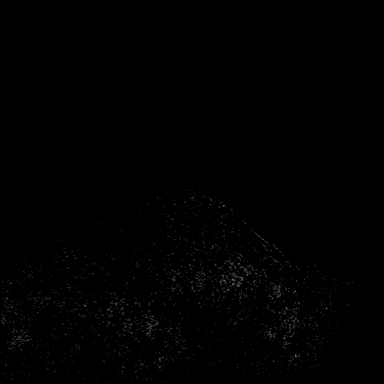

[Series 8: needle confirmation · axial · 1.3mm · 0.73mm/px · z∈[-92,+93]mm · 5 of 144 slices shown]
[im 1/144]
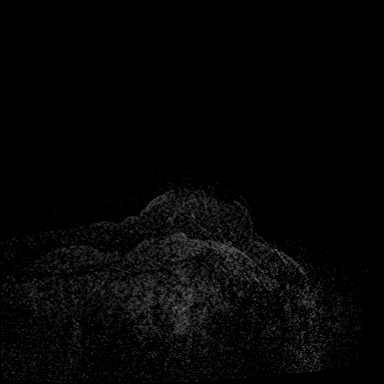
[im 36/144]
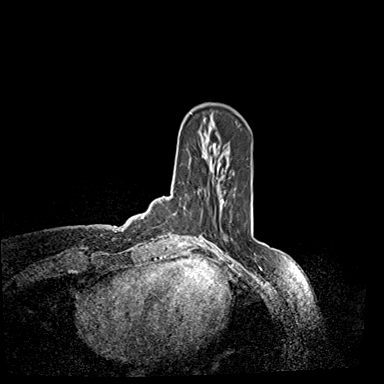
[im 72/144]
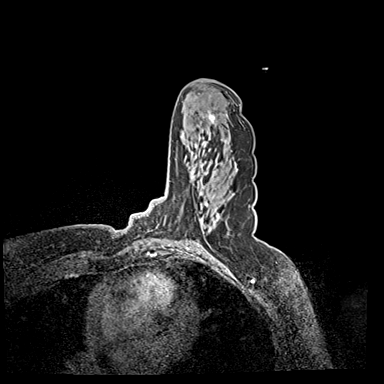
[im 108/144]
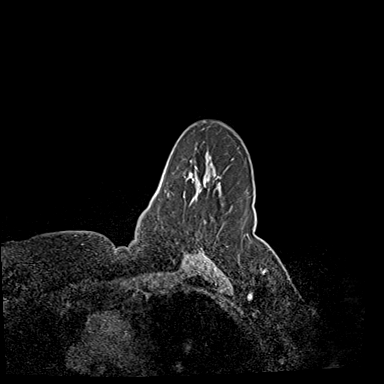
[im 144/144]
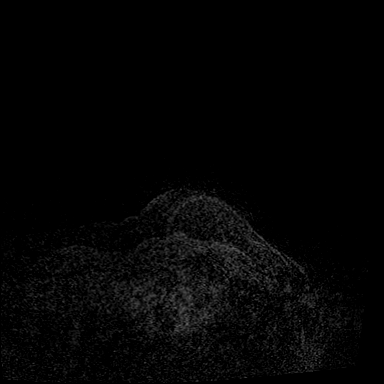

[33 of 48 positions shown; findings below may reference images not displayed]

FINDINGS: I met with the patient, and we discussed the procedure of MRI guided
biopsy, including risks, benefits, and alternatives. Specifically,
we discussed the risks of infection, bleeding, tissue injury, clip
migration, and inadequate sampling. Informed, written consent was
given. The usual time out protocol was performed immediately prior
to the procedure.

Using sterile technique, 1% Lidocaine, MRI guidance, and a 9 gauge
vacuum assisted device, biopsy was performed of the 6 mm enhancing,
12 o'clock retroareolar left breast mass using a lateral approach.
At the conclusion of the procedure, a dumbbell-shaped tissue marker
clip was deployed into the biopsy cavity. Follow-up 2-view mammogram
was performed and dictated separately.
IMPRESSION: MRI guided biopsy of a small enhancing left breast mass. No apparent
complications.

ADDENDUM:
Pathology revealed FIBROSIS AND BENIGN BREAST DUCTS WITH
CALCIFICATIONS- NO EVIDENCE OF MALIGNANCY of the LEFT breast, 12
o'clock, retroareolar. This was found to be concordant by Dr. HTOO
HTOO.

Pathology results were discussed with the patient by telephone. The
patient reported doing well after the biopsy with tenderness at the
site. Post biopsy instructions and care were reviewed and questions
were answered. The patient was encouraged to call The [REDACTED]

The patient is scheduled for 2 additional MRI biopsies of the RIGHT
breast on [DATE]. Further recommendations will be guided by these
results.

Breast MRI recommended in 6 months per protocol to follow LEFT side.

Pathology results reported by HTOO RN on [DATE].

*** End of Addendum ***
FINDINGS: I met with the patient, and we discussed the procedure of MRI guided
biopsy, including risks, benefits, and alternatives. Specifically,
we discussed the risks of infection, bleeding, tissue injury, clip
migration, and inadequate sampling. Informed, written consent was
given. The usual time out protocol was performed immediately prior
to the procedure.

Using sterile technique, 1% Lidocaine, MRI guidance, and a 9 gauge
vacuum assisted device, biopsy was performed of the 6 mm enhancing,
12 o'clock retroareolar left breast mass using a lateral approach.
At the conclusion of the procedure, a dumbbell-shaped tissue marker
clip was deployed into the biopsy cavity. Follow-up 2-view mammogram
was performed and dictated separately.
IMPRESSION: MRI guided biopsy of a small enhancing left breast mass. No apparent
complications.

## 2021-07-17 IMAGING — MG MM BREAST LOCALIZATION CLIP
4 series · 4 of 12 positions shown · non-contrast
Comparison: Previous exam(s).

CLINICAL DATA: Evaluate post biopsy marker clip placement following
MRI guided core needle biopsy a small enhancing left breast mass.

EXAM:
3D DIAGNOSTIC LEFT MAMMOGRAM POST MRI BIOPSY

[L CC synth-2D]
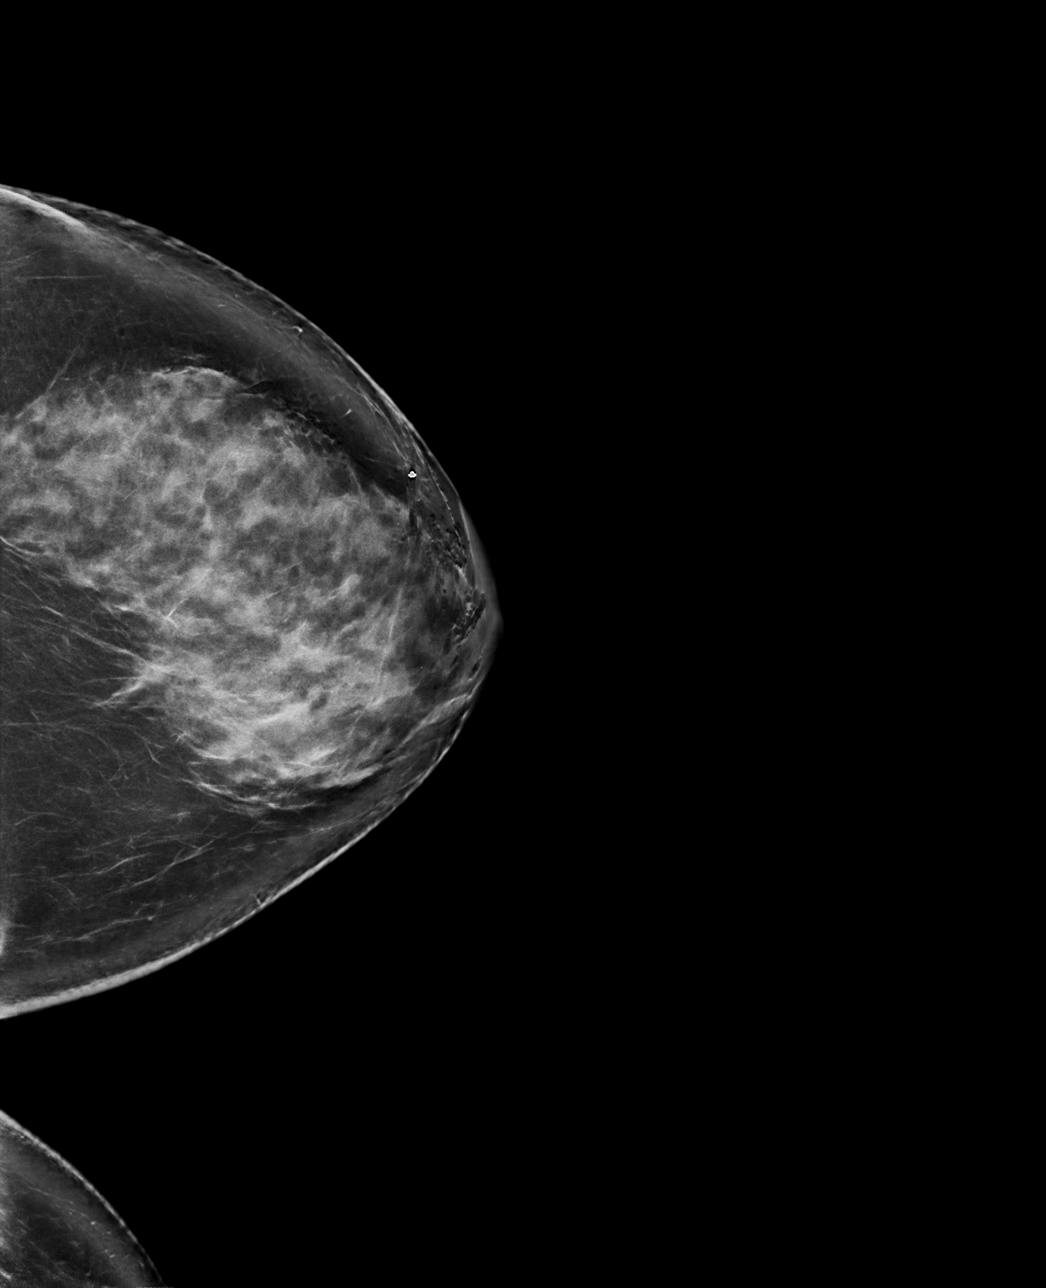

[L ML synth-2D]
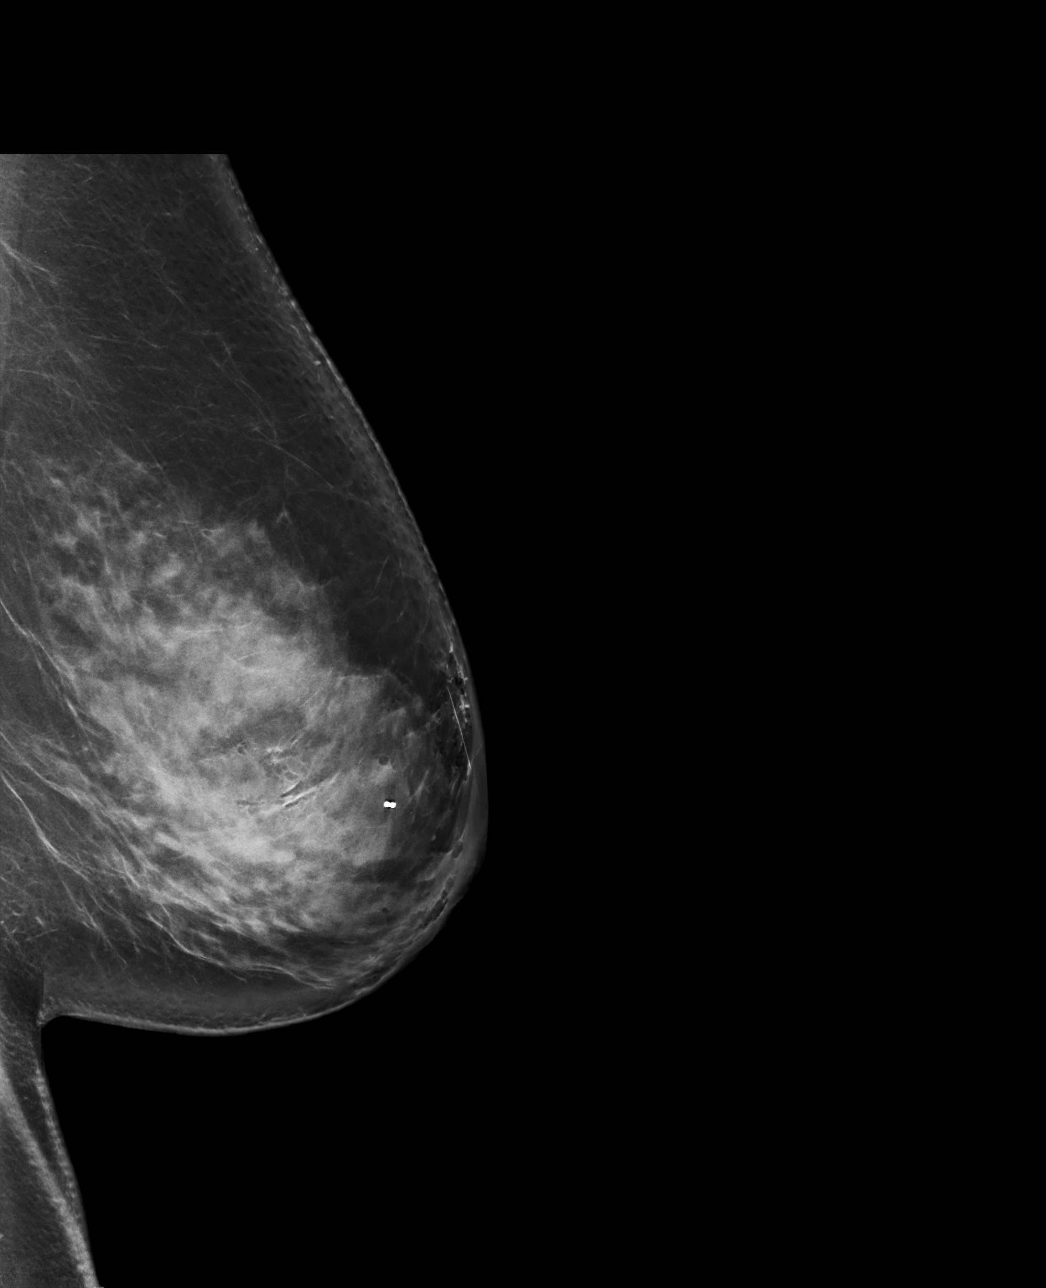

[L CC tomo · tomo slice 47/94.0]
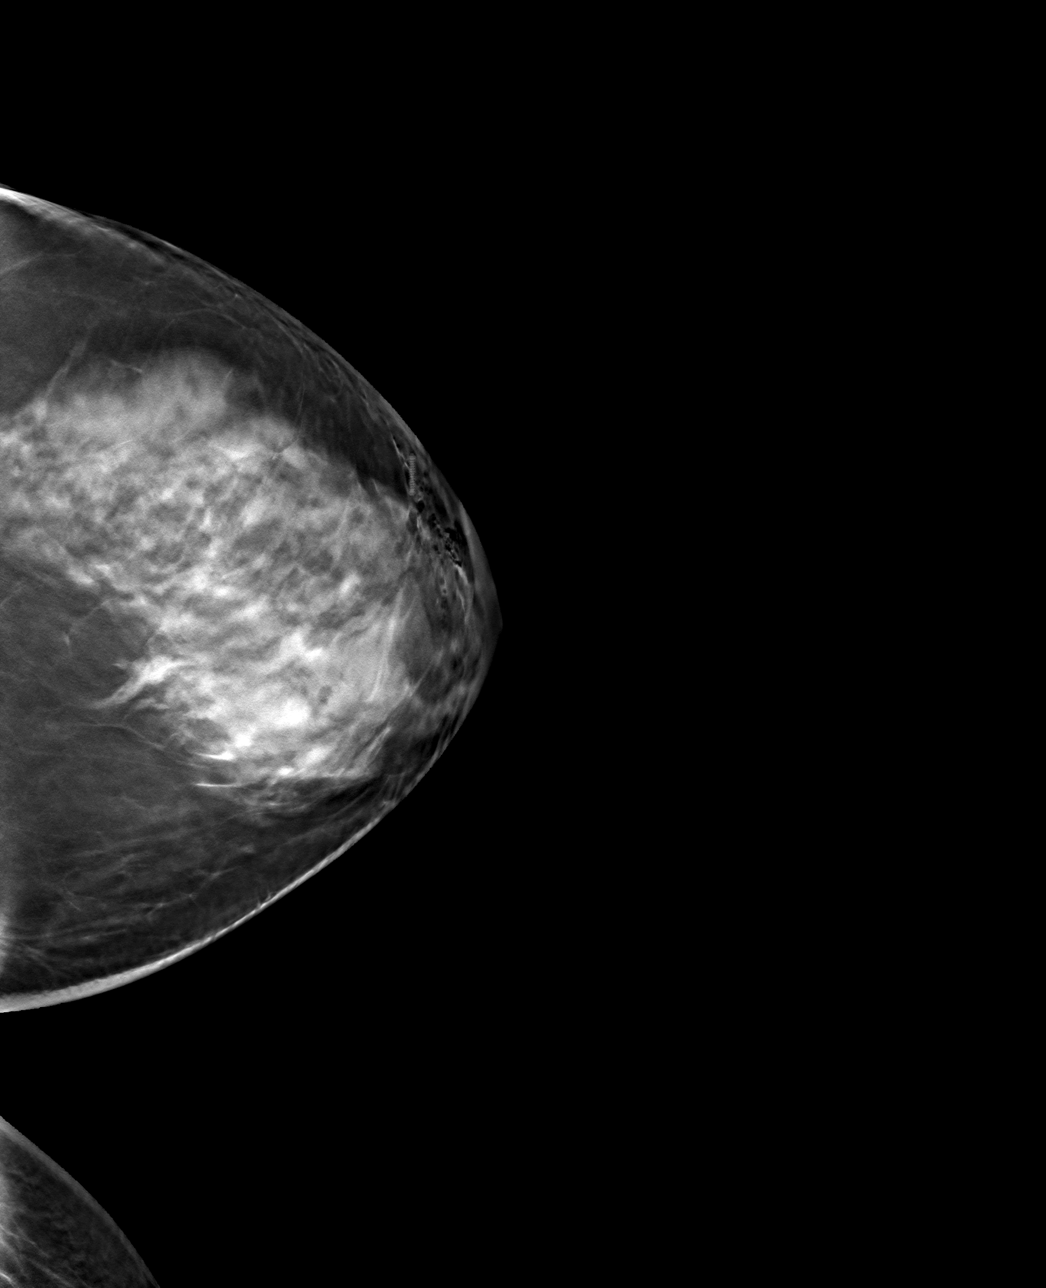

[L ML tomo · tomo slice 49/98.0]
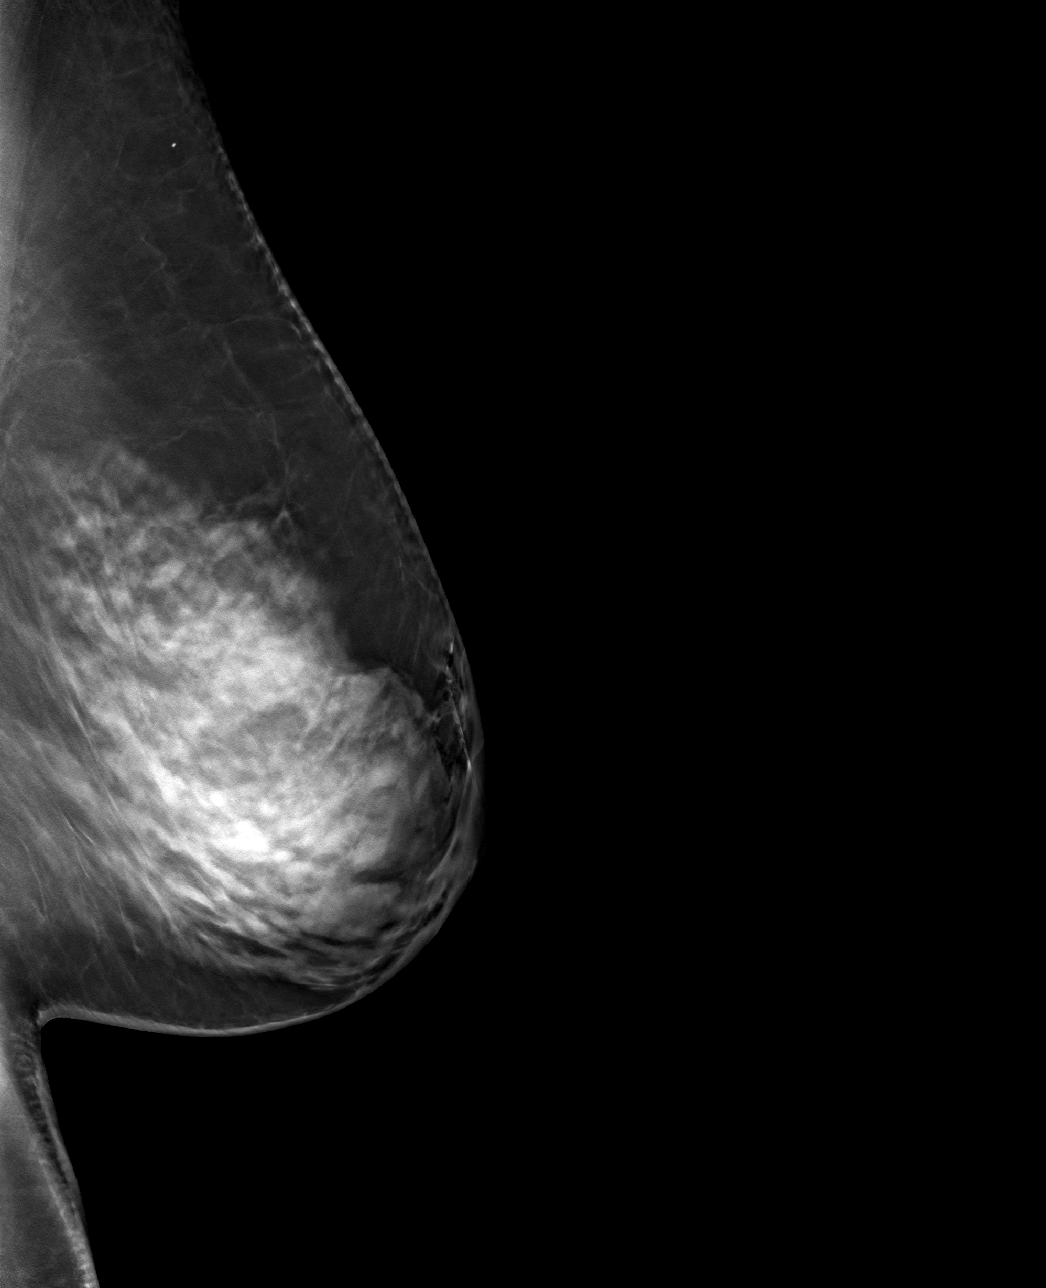

[4 of 12 positions shown; findings below may reference images not displayed]

FINDINGS: 3D Mammographic images were obtained following MRI guided biopsy of
a small enhancing left breast mass. The biopsy marking clipappears
to be displaced laterally, proximally 1.5 cm, from the expected
location of the enhancing mass seen on MRI.
IMPRESSION: The dumbbell shaped biopsy marking clip is displaced laterally,
approximally 1.5 cm, from the expected location of the small
enhancing mass noted on MRI.

Final Assessment: Post Procedure Mammograms for Marker Placement

## 2023-01-16 IMAGING — MG MM BREAST LOCALIZATION CLIP
4 series · 4 of 12 positions shown · non-contrast
Comparison: Previous exam(s).

CLINICAL DATA: Patient is post MRI guided biopsy 2 sites right
breast.

EXAM:
3D DIAGNOSTIC RIGHT MAMMOGRAM POST MRI BIOPSY

[R ML synth-2D]
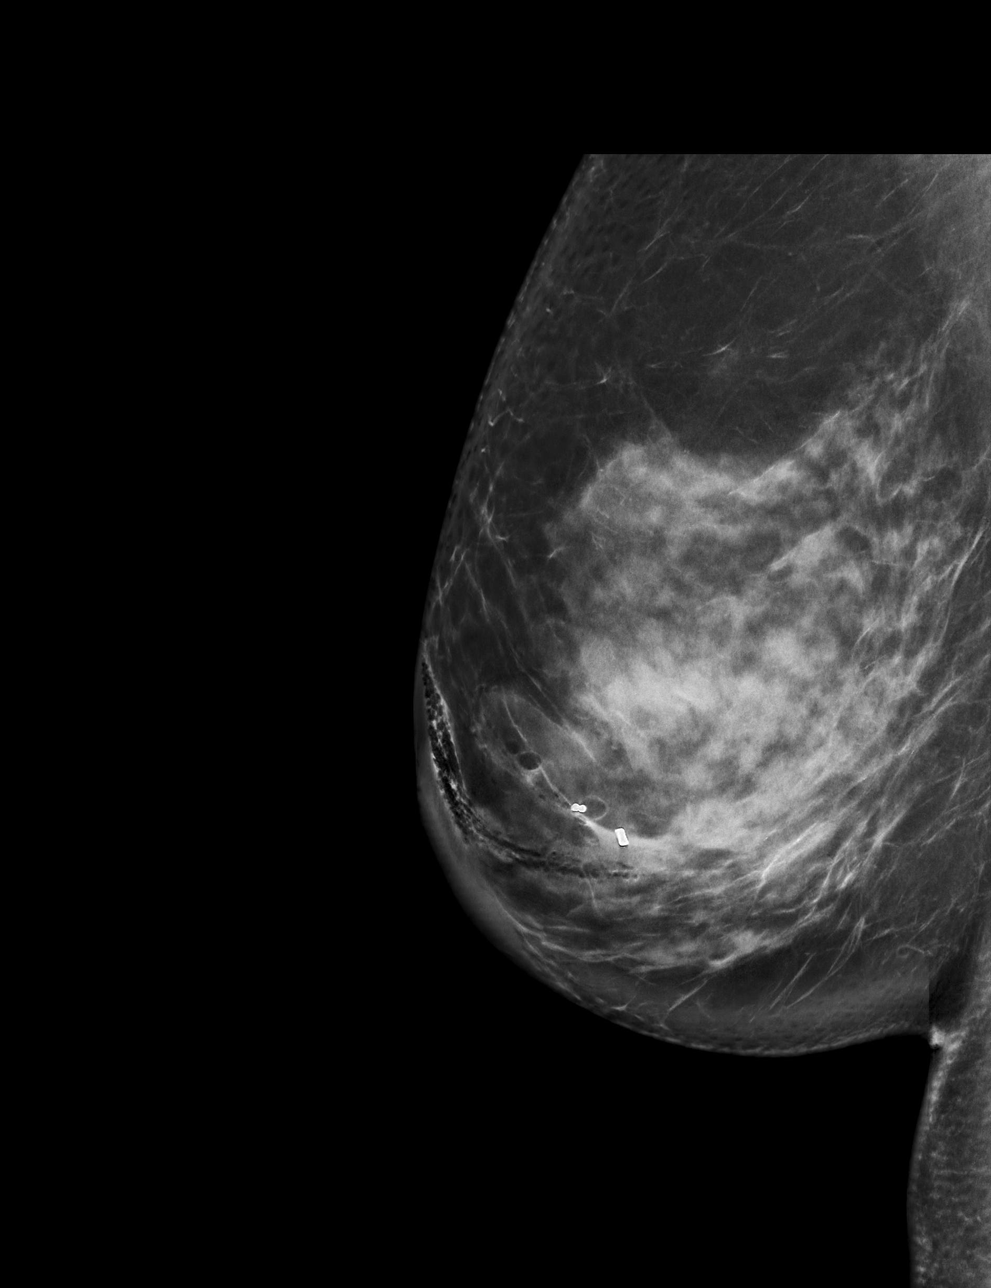

[R CC synth-2D]
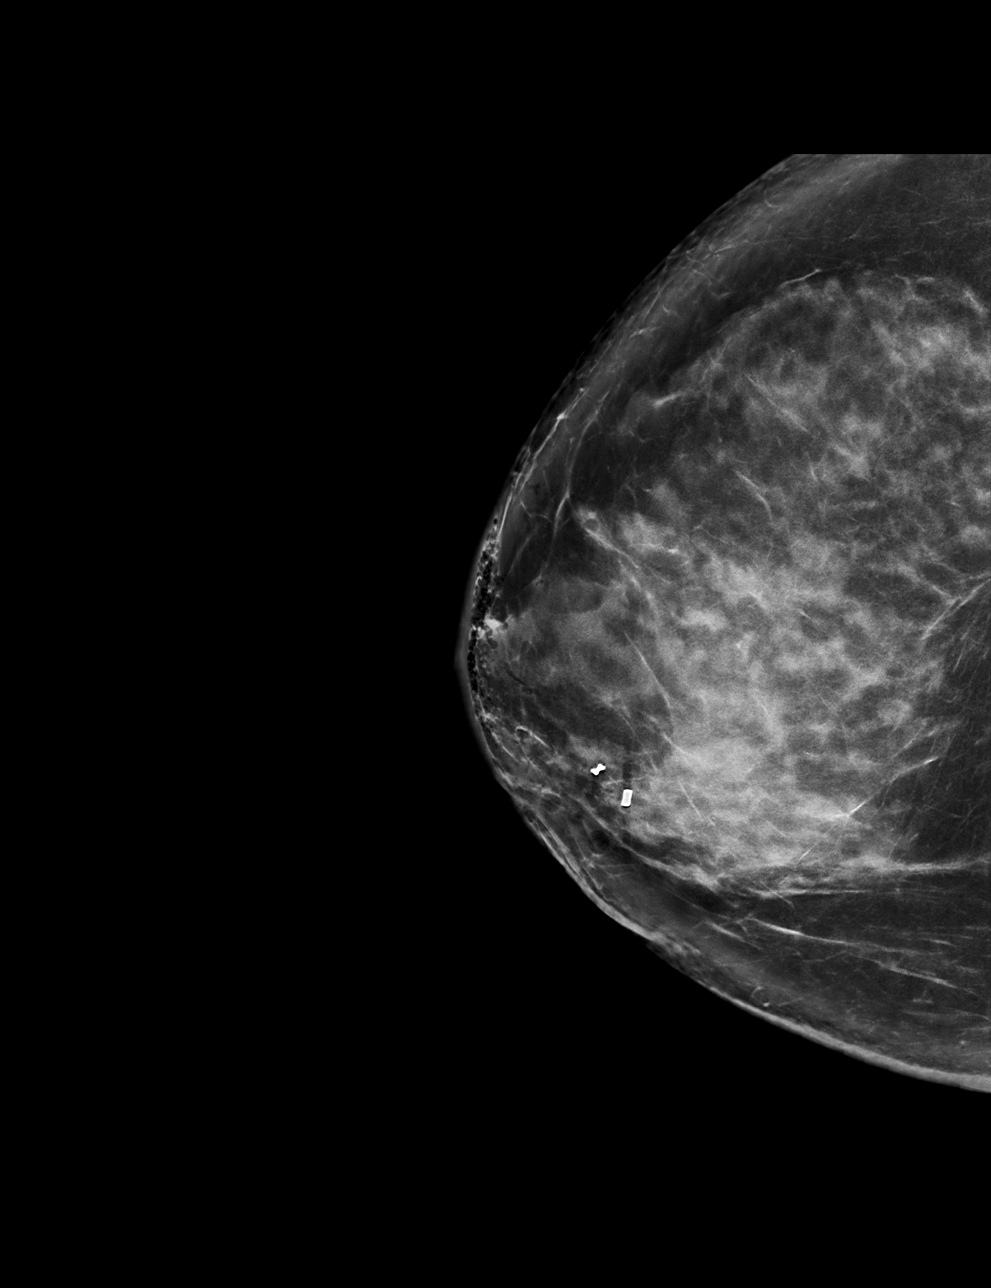

[R ML tomo · tomo slice 47/94.0]
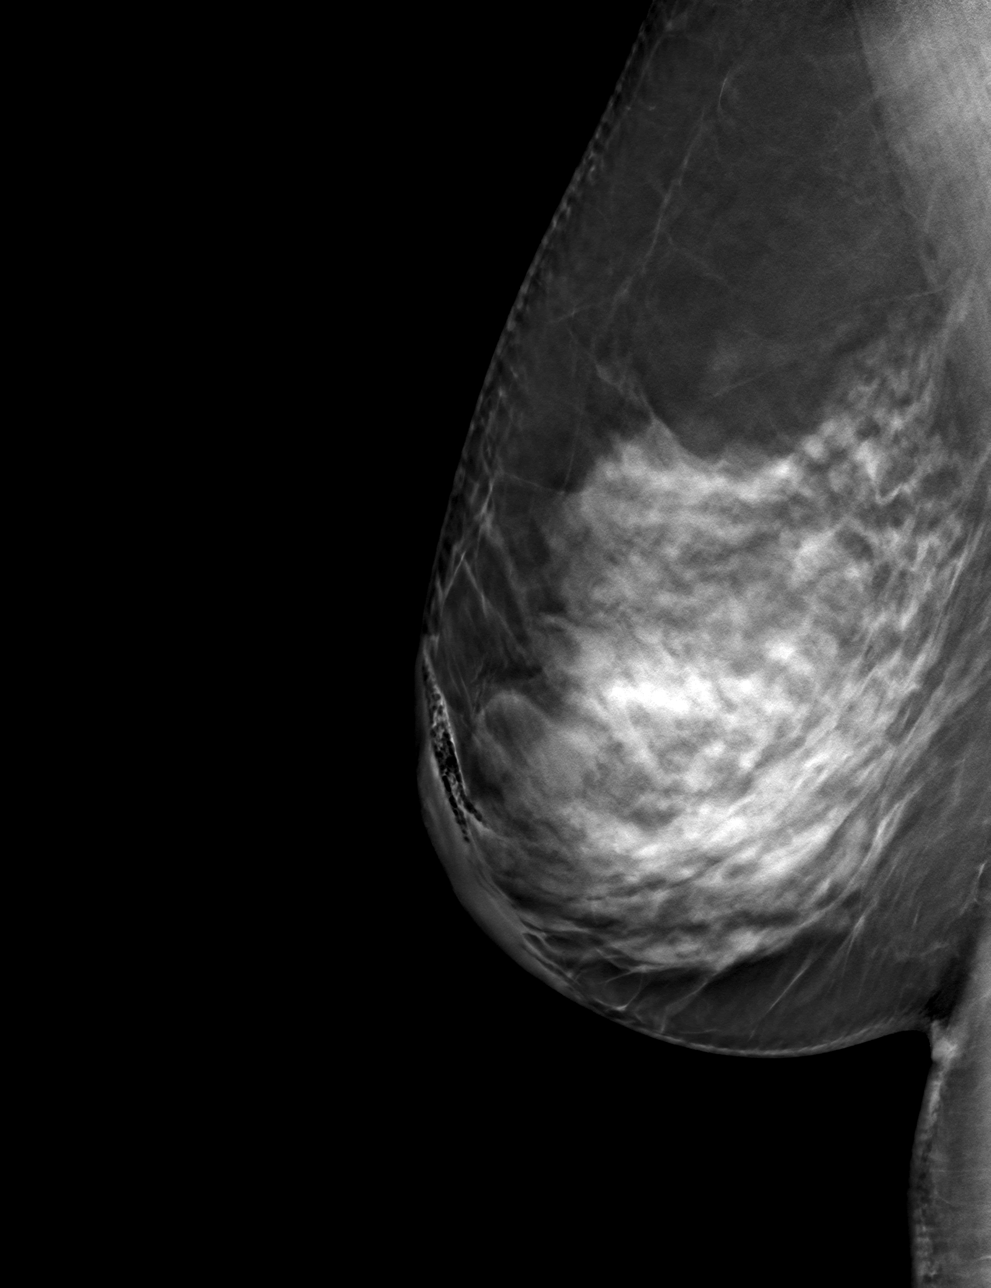

[R CC tomo · tomo slice 46/91.0]
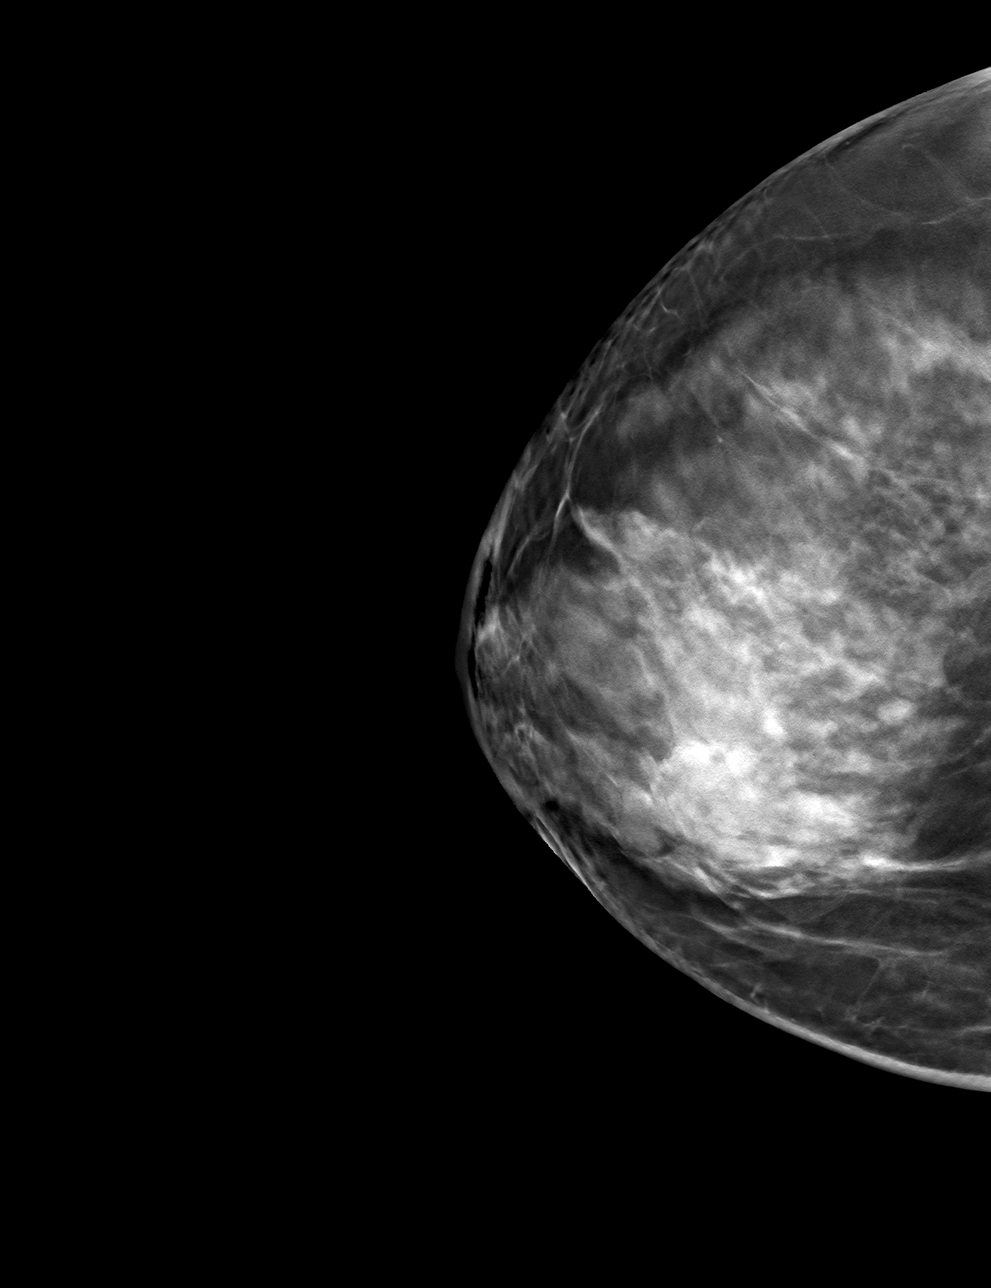

[4 of 12 positions shown; findings below may reference images not displayed]

FINDINGS: 3D Mammographic images were obtained following MRI guided biopsy of
2 adjacent sites over the anterior right breast. The biopsy marking
clip is in expected position at the 2 sites of biopsy.
IMPRESSION: 1. Appropriate positioning of the barbell shaped biopsy marking clip
at the site of biopsy in the anterior 3 o'clock position.

2. Appropriate positioning of the cylinder shaped biopsy marking
clip at the site of biopsy in the anterior inner lower quadrant.

Final Assessment: Post Procedure Mammograms for Marker Placement

## 2023-01-16 IMAGING — MR MR BREAST BX W/ LOC DEV 1ST LEASION IMAGE BX SPEC MR GUIDE*R*
6 of 10 series · 26 of 48 positions shown · IV contrast (gadavist)
Comparison: Previous exams.
COMPARISON: Previous exams.
COMPARISON: Previous exams.
COMPARISON: Previous exams.

Addendum:
CLINICAL DATA: Patient presents for MRI guided biopsy of 2 sites
right breast noted on recent high risk screening diagnostic MRI. We
will target 7 mm non mass enhancement over the anterior inner
midportion of the right breast (3 o'clock position) as well as 6 mm
focal non mass enhancement over the anterior right inner lower
quadrant. Patient underwent MRI guided biopsy of single site left
breast yesterday.

EXAM:
MRI GUIDED CORE NEEDLE BIOPSY OF THE RIGHT BREAST
TECHNIQUE: Multiplanar, multisequence MR imaging of the right breast was
performed both before and after administration of intravenous
contrast.
CONTRAST:  7 mm Gadavist IV.

[Series 2: fiducial unilateral · sagittal · 2.0mm · 1.33mm/px · 3 of 52 slices shown]
[im 1/52]
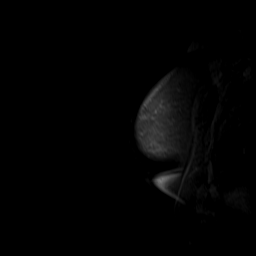
[im 26/52]
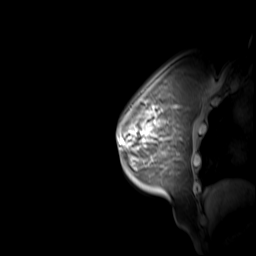
[im 52/52]
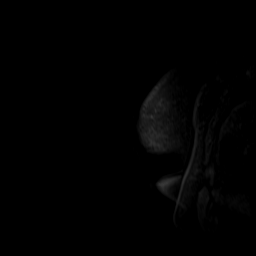

[Series 3: dynamic pre · axial · non-contrast · 1.3mm · 0.73mm/px · z∈[-123,+84]mm · 5 of 160 slices shown]
[im 1/160]
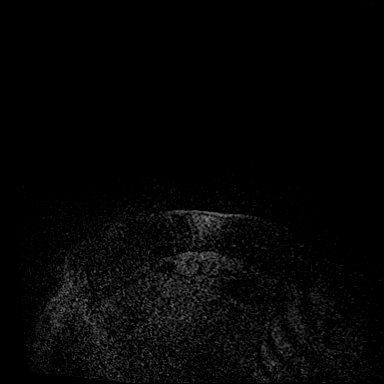
[im 40/160]
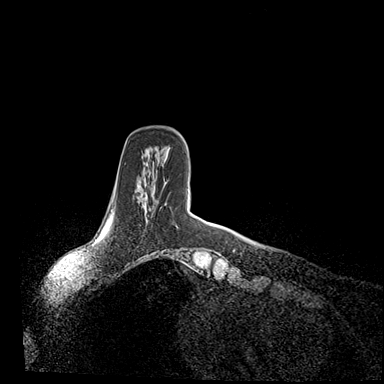
[im 80/160]
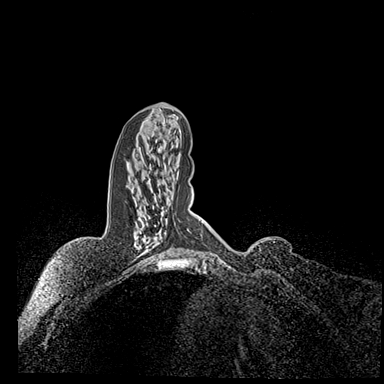
[im 120/160]
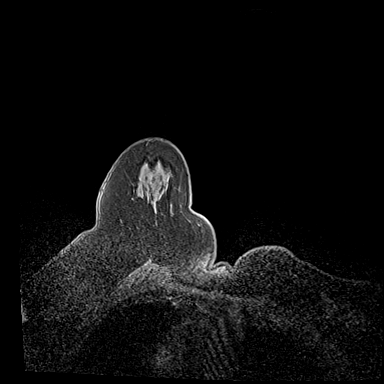
[im 160/160]
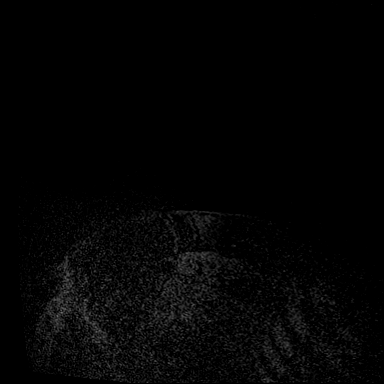

[Series 4: dynamic post 20 · axial · 1.3mm · 0.73mm/px · z∈[-123,+84]mm · 5 of 160 slices shown (1 of 2)]
[im 1/160]
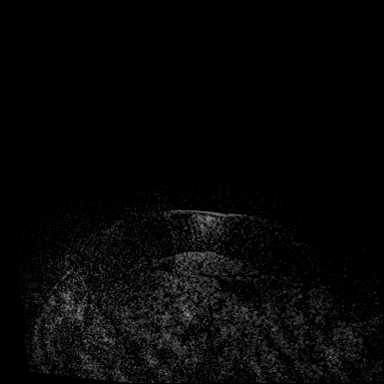
[im 40/160]
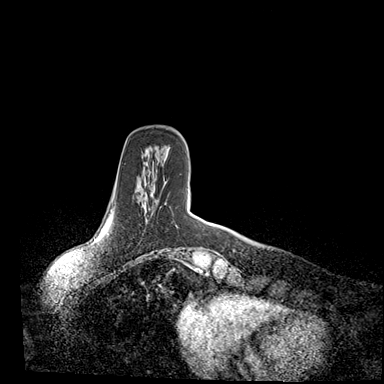
[im 80/160]
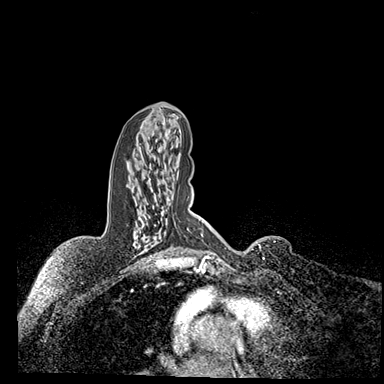
[im 120/160]
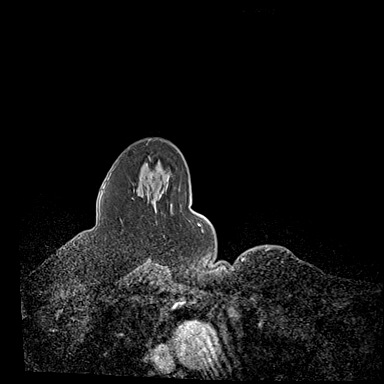
[im 160/160]
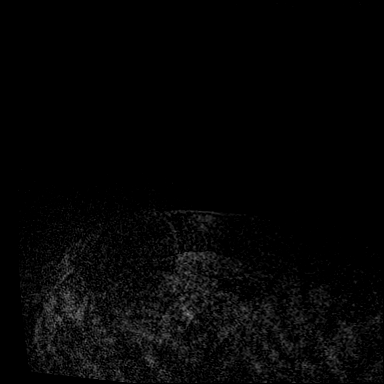

[Series 5: dynamic post 20 · axial · 1.3mm · 0.73mm/px · z∈[-123,+84]mm · 5 of 160 slices shown (2 of 2)]
[im 1/160]
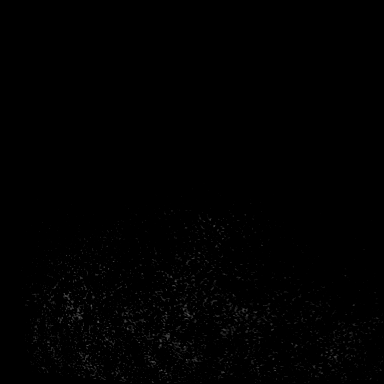
[im 40/160]
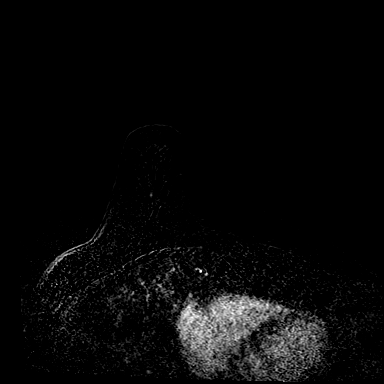
[im 80/160]
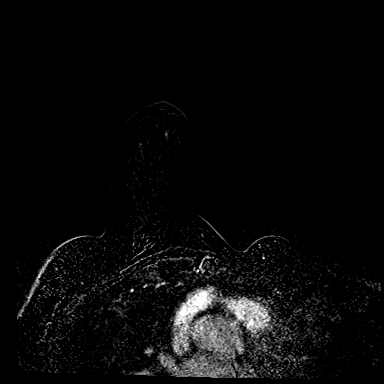
[im 120/160]
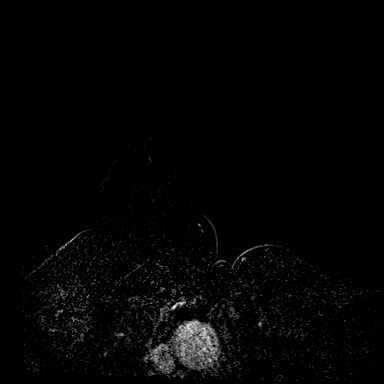
[im 160/160]
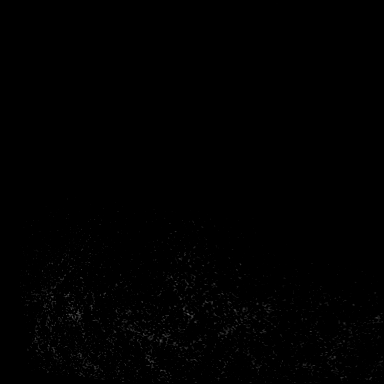

[Series 6: dynamic post 3 · axial · 1.3mm · 0.73mm/px · z∈[-123,+84]mm · 5 of 160 slices shown (1 of 2)]
[im 1/160]
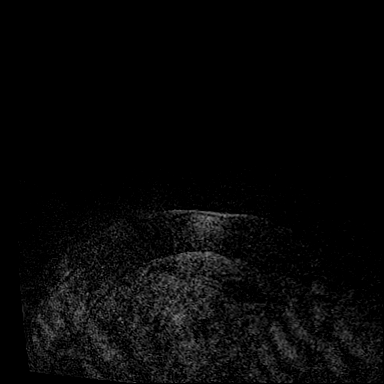
[im 40/160]
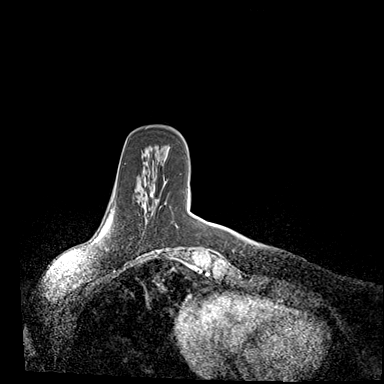
[im 80/160]
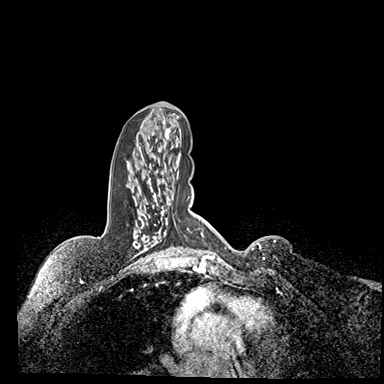
[im 120/160]
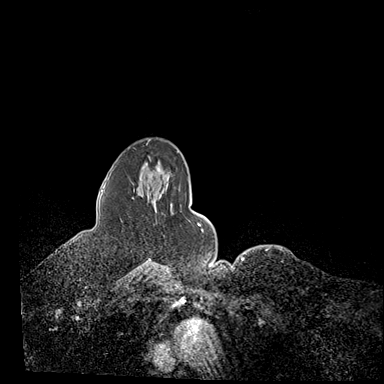
[im 160/160]
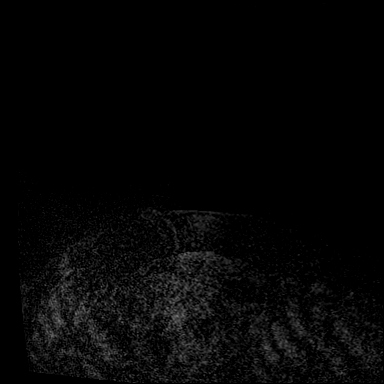

[Series 7: dynamic post 3 · axial · 1.3mm · 0.73mm/px · z∈[-123,-20]mm · 3 of 160 slices shown (2 of 2)]
[im 1/160]
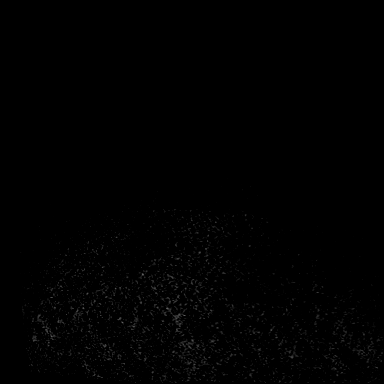
[im 40/160]
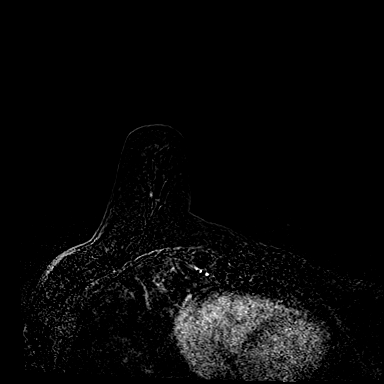
[im 80/160]
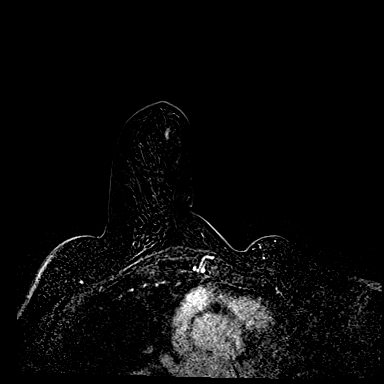

[26 of 48 positions shown; findings below may reference images not displayed]

FINDINGS: I met with the patient, and we discussed the procedure of MRI guided
biopsy, including risks, benefits, and alternatives. Specifically,
we discussed the risks of infection, bleeding, tissue injury, clip
migration, and inadequate sampling. Informed, written consent was
given. The usual time out protocol was performed immediately prior
to the procedure.

1. Using sterile technique, 1% Lidocaine, MRI guidance, and a 9
gauge vacuum assisted device, biopsy was performed of the targeted 7
mm non mass enhancement over the anterior 3 o'clock position right
breast using a medial to lateral approach. At the conclusion of the
procedure, a barbell shaped tissue marker clip was deployed into the
biopsy cavity. Follow-up 2-view mammogram was performed and dictated
separately.

2. Using sterile technique, 1% Lidocaine, MRI guidance, and a 9
gauge vacuum assisted device, biopsy was performed of the targeted 6
mm non mass enhancement over the anterior inner lower quadrant right
breast using a medial to lateral approach. At the conclusion of the
procedure, a cylinder shaped tissue marker clip was deployed into
the biopsy cavity. Follow-up 2-view mammogram was performed and
dictated separately.
IMPRESSION: MRI guided biopsy of 2 sites right breast as described. No apparent
complications.

ADDENDUM:
Pathology revealed FIBROCYSTIC CHANGES INCLUDING APOCRINE METAPLASIA
of the Right breast, anterior, 3 o'clock, (barbell clip). This was
found to be concordant by Dr. MUSSAGY.

Pathology revealed FIBROCYSTIC CHANGES INCLUDING APOCRINE METAPLASIA
of the Right breast, anterior, inner lower quadrant, (cylinder
clip). This was found to be concordant by Dr. MUSSAGY.

Pathology results were discussed with the patient by telephone by
MUSSAGY, RN Nurse Navigator. The patient reported doing well
after the biopsies with tenderness at the sites. Post biopsy
instructions and care were reviewed and questions were answered. The
patient was encouraged to call [REDACTED] for any additional concerns.

The patient was instructed to return for a bilateral breast MRI in 6
months, per protocol, and to continue with annual mammography.

Pathology results reported by MUSSAGY, RN on [DATE].

*** End of Addendum ***
FINDINGS: I met with the patient, and we discussed the procedure of MRI guided
biopsy, including risks, benefits, and alternatives. Specifically,
we discussed the risks of infection, bleeding, tissue injury, clip
migration, and inadequate sampling. Informed, written consent was
given. The usual time out protocol was performed immediately prior
to the procedure.

1. Using sterile technique, 1% Lidocaine, MRI guidance, and a 9
gauge vacuum assisted device, biopsy was performed of the targeted 7
mm non mass enhancement over the anterior 3 o'clock position right
breast using a medial to lateral approach. At the conclusion of the
procedure, a barbell shaped tissue marker clip was deployed into the
biopsy cavity. Follow-up 2-view mammogram was performed and dictated
separately.

2. Using sterile technique, 1% Lidocaine, MRI guidance, and a 9
gauge vacuum assisted device, biopsy was performed of the targeted 6
mm non mass enhancement over the anterior inner lower quadrant right
breast using a medial to lateral approach. At the conclusion of the
procedure, a cylinder shaped tissue marker clip was deployed into
the biopsy cavity. Follow-up 2-view mammogram was performed and
dictated separately.
IMPRESSION: MRI guided biopsy of 2 sites right breast as described. No apparent
complications.

Addendum:
FINDINGS: I met with the patient, and we discussed the procedure of MRI guided
biopsy, including risks, benefits, and alternatives. Specifically,
we discussed the risks of infection, bleeding, tissue injury, clip
migration, and inadequate sampling. Informed, written consent was
given. The usual time out protocol was performed immediately prior
to the procedure.

1. Using sterile technique, 1% Lidocaine, MRI guidance, and a 9
gauge vacuum assisted device, biopsy was performed of the targeted 7
mm non mass enhancement over the anterior 3 o'clock position right
breast using a medial to lateral approach. At the conclusion of the
procedure, a barbell shaped tissue marker clip was deployed into the
biopsy cavity. Follow-up 2-view mammogram was performed and dictated
separately.

2. Using sterile technique, 1% Lidocaine, MRI guidance, and a 9
gauge vacuum assisted device, biopsy was performed of the targeted 6
mm non mass enhancement over the anterior inner lower quadrant right
breast using a medial to lateral approach. At the conclusion of the
procedure, a cylinder shaped tissue marker clip was deployed into
the biopsy cavity. Follow-up 2-view mammogram was performed and
dictated separately.
IMPRESSION: MRI guided biopsy of 2 sites right breast as described. No apparent
complications.

ADDENDUM:
Pathology revealed FIBROCYSTIC CHANGES INCLUDING APOCRINE METAPLASIA
of the Right breast, anterior, 3 o'clock, (barbell clip). This was
found to be concordant by Dr. Gil Nah.

Pathology revealed FIBROCYSTIC CHANGES INCLUDING APOCRINE METAPLASIA
of the Right breast, anterior, inner lower quadrant, (cylinder
clip). This was found to be concordant by Dr. Gil Nah.

Pathology results were discussed with the patient by telephone by
Shanthi Guerin, RN Nurse Navigator. The patient reported doing well
after the biopsies with tenderness at the sites. Post biopsy
instructions and care were reviewed and questions were answered. The
patient was encouraged to call [REDACTED] for any additional concerns.

The patient was instructed to return for a bilateral breast MRI in 6
months, per protocol, and to continue with annual mammography.

Pathology results reported by Zareen Fleisher, RN on 12/09/2021.

*** End of Addendum ***
FINDINGS: I met with the patient, and we discussed the procedure of MRI guided
biopsy, including risks, benefits, and alternatives. Specifically,
we discussed the risks of infection, bleeding, tissue injury, clip
migration, and inadequate sampling. Informed, written consent was
given. The usual time out protocol was performed immediately prior
to the procedure.

1. Using sterile technique, 1% Lidocaine, MRI guidance, and a 9
gauge vacuum assisted device, biopsy was performed of the targeted 7
mm non mass enhancement over the anterior 3 o'clock position right
breast using a medial to lateral approach. At the conclusion of the
procedure, a barbell shaped tissue marker clip was deployed into the
biopsy cavity. Follow-up 2-view mammogram was performed and dictated
separately.

2. Using sterile technique, 1% Lidocaine, MRI guidance, and a 9
gauge vacuum assisted device, biopsy was performed of the targeted 6
mm non mass enhancement over the anterior inner lower quadrant right
breast using a medial to lateral approach. At the conclusion of the
procedure, a cylinder shaped tissue marker clip was deployed into
the biopsy cavity. Follow-up 2-view mammogram was performed and
dictated separately.
IMPRESSION: MRI guided biopsy of 2 sites right breast as described. No apparent
complications.
